# Patient Record
Sex: Female | Born: 1995 | Race: Black or African American | Hispanic: No | Marital: Single | Smoking: Former smoker
Health system: Southern US, Community
[De-identification: ages and names within clinical notes are randomized; demographics above are authoritative.]

## PROBLEM LIST (undated history)

## (undated) DIAGNOSIS — R51 Headache: Secondary | ICD-10-CM

## (undated) DIAGNOSIS — J45909 Unspecified asthma, uncomplicated: Secondary | ICD-10-CM

## (undated) DIAGNOSIS — E282 Polycystic ovarian syndrome: Secondary | ICD-10-CM

## (undated) HISTORY — DX: Headache: R51

## (undated) HISTORY — DX: Polycystic ovarian syndrome: E28.2

## (undated) HISTORY — DX: Unspecified asthma, uncomplicated: J45.909

---
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
## 2016-01-22 ENCOUNTER — Encounter (HOSPITAL_COMMUNITY): Payer: Self-pay | Admitting: Emergency Medicine

## 2016-01-22 ENCOUNTER — Emergency Department (HOSPITAL_COMMUNITY): Payer: Medicaid Other

## 2016-01-22 DIAGNOSIS — Z7722 Contact with and (suspected) exposure to environmental tobacco smoke (acute) (chronic): Secondary | ICD-10-CM | POA: Insufficient documentation

## 2016-01-22 DIAGNOSIS — J209 Acute bronchitis, unspecified: Secondary | ICD-10-CM | POA: Diagnosis not present

## 2016-01-22 DIAGNOSIS — R0789 Other chest pain: Secondary | ICD-10-CM | POA: Diagnosis present

## 2016-01-22 DIAGNOSIS — J45909 Unspecified asthma, uncomplicated: Secondary | ICD-10-CM | POA: Insufficient documentation

## 2016-01-22 LAB — URINE MICROSCOPIC-ADD ON: WBC, UA: NONE SEEN WBC/hpf (ref 0–5)

## 2016-01-22 LAB — CBC
HCT: 30.2 % — ABNORMAL LOW (ref 36.0–46.0)
Hemoglobin: 9.5 g/dL — ABNORMAL LOW (ref 12.0–15.0)
MCH: 21.7 pg — AB (ref 26.0–34.0)
MCHC: 31.5 g/dL (ref 30.0–36.0)
MCV: 69.1 fL — ABNORMAL LOW (ref 78.0–100.0)
PLATELETS: 300 10*3/uL (ref 150–400)
RBC: 4.37 MIL/uL (ref 3.87–5.11)
RDW: 16.8 % — AB (ref 11.5–15.5)
WBC: 5.6 10*3/uL (ref 4.0–10.5)

## 2016-01-22 LAB — BASIC METABOLIC PANEL
Anion gap: 7 (ref 5–15)
BUN: 12 mg/dL (ref 6–20)
CALCIUM: 9.2 mg/dL (ref 8.9–10.3)
CO2: 25 mmol/L (ref 22–32)
CREATININE: 0.62 mg/dL (ref 0.44–1.00)
Chloride: 104 mmol/L (ref 101–111)
GFR calc non Af Amer: >60 mL/min (ref >60)
Glucose, Bld: 112 mg/dL — ABNORMAL HIGH (ref 65–99)
Potassium: 3.4 mmol/L — ABNORMAL LOW (ref 3.5–5.1)
SODIUM: 136 mmol/L (ref 135–145)

## 2016-01-22 LAB — URINALYSIS, ROUTINE W REFLEX MICROSCOPIC
BILIRUBIN URINE: NEGATIVE
Glucose, UA: NEGATIVE mg/dL
KETONES UR: 15 mg/dL — AB
Leukocytes, UA: NEGATIVE
NITRITE: NEGATIVE
PH: 6 (ref 5.0–8.0)
Protein, ur: NEGATIVE mg/dL
Specific Gravity, Urine: 1.036 — ABNORMAL HIGH (ref 1.005–1.030)

## 2016-01-22 LAB — I-STAT TROPONIN, ED: TROPONIN I, POC: 0.01 ng/mL (ref 0.00–0.08)

## 2016-01-22 LAB — POC URINE PREG, ED: Preg Test, Ur: NEGATIVE

## 2016-01-22 NOTE — ED Triage Notes (Signed)
Pt. reports intermittent central chest pain with SOB , dry cough and emesis onset 2 days ago .

## 2016-01-23 ENCOUNTER — Emergency Department (HOSPITAL_COMMUNITY)
Admission: EM | Admit: 2016-01-23 | Discharge: 2016-01-23 | Disposition: A | Discharge status: Home / Self Care | Payer: Medicaid Other | Attending: Emergency Medicine | Admitting: Emergency Medicine

## 2016-01-23 DIAGNOSIS — R0789 Other chest pain: Secondary | ICD-10-CM

## 2016-01-23 DIAGNOSIS — R05 Cough: Secondary | ICD-10-CM

## 2016-01-23 DIAGNOSIS — J209 Acute bronchitis, unspecified: Secondary | ICD-10-CM

## 2016-01-23 DIAGNOSIS — R059 Cough, unspecified: Secondary | ICD-10-CM

## 2016-01-23 MED ORDER — IBUPROFEN 600 MG PO TABS: 600.0000 mg | ORAL_TABLET | Freq: Four times a day (QID) | ORAL | 0 refills | Status: DC | PRN

## 2016-01-23 NOTE — ED Provider Notes (Signed)
[facility] DEPT Provider Note   CSN: [id number] Arrival date & time: 01/22/16  2052  By signing my name below, I, Higinio Plan, attest that this documentation has been prepared under the direction and in the presence of Charlesetta Shanks, MD . Electronically Signed: Higinio Plan, Scribe. 01/23/2016. 1:34 AM.  History   Chief Complaint Chief Complaint  Patient presents with  . Chest Pain   The history is provided by the patient. No language interpreter was used.   HPI Comments: Brooke Collins is a 20 y.o. female who presents to the Emergency Department complaining of gradually worsening, central chest pain described as tightness that began 3 days ago and worsened today. Pt reports associated shortness of breath, dry cough, sinus pressure and lower abdominal pain that began 2 days ago. She states her cough is worse at night. She denies fever, sore throat, nasal drip, dysuria and recent use of antibiotics. Pt reports hx of bronchitis "a while ago" but denies hx of asthma. She notes she has an inhaler but has not used it for her current symptoms. She states she smokes marijuana 3-4 times a week and smoked once already today.   Past Medical History:  Diagnosis Date  . Asthma   . Headache(784.0)   . PCOS (polycystic ovarian syndrome)     Patient Active Problem List   Diagnosis Date Noted  . Migraine without aura, without mention of intractable migraine without mention of status migrainosus 10/09/2013  . Episodic tension type headache 10/09/2013    Past Surgical History:  Procedure Laterality Date  . ADENOIDECTOMY, TONSILLECTOMY AND MYRINGOTOMY WITH TUBE PLACEMENT Bilateral 2005    OB History    No data available     Home Medications    Prior to Admission medications   Medication Sig Start Date End Date Taking? Authorizing Provider  albuterol (PROVENTIL HFA;VENTOLIN HFA) 108 (90 BASE) MCG/ACT inhaler Inhale 2 puffs into the lungs every 6 (six) hours as needed. Shortness of breath     Historical Provider, MD  Calcium Carbonate (CALCIUM 500 PO) Take 1 tablet by mouth daily.     Historical Provider, MD  cetirizine (ZYRTEC) 10 MG tablet Take 10 mg by mouth daily as needed. For allergies    Historical Provider, MD  Cholecalciferol (VITAMIN D) 2000 UNITS CAPS Take by mouth.    Historical Provider, MD  ferrous sulfate 325 (65 FE) MG tablet Take 325 mg by mouth daily with breakfast.    Historical Provider, MD  ibuprofen (ADVIL,MOTRIN) 600 MG tablet Take 1 tablet (600 mg total) by mouth every 6 (six) hours as needed. 01/23/16   Charlesetta Shanks, MD  norethindrone-ethinyl estradiol-iron (MICROGESTIN FE,GILDESS FE,LOESTRIN FE) 1.5-30 MG-MCG tablet Take 1 tablet by mouth daily.    Historical Provider, MD  tiZANidine (ZANAFLEX) 4 MG capsule Take one capsule for persistent headache only if you are able to lay down and sleep. 11/23/13   Jodi Geralds, MD  topiramate (TOPAMAX) 25 MG tablet Take one tablet at nighttime for one week then 2 tablets at nighttime 11/23/13   Jodi Geralds, MD    Family History Family History  Problem Relation Age of Onset  . Migraines Mother     Onset at early adulthood  . Bronchitis Mother   . Hypertension Mother   . Migraines Father     Onset at early adulthood  . Bronchitis Father   . Migraines Sister   . Sickle cell trait Cousin   . Diabetes Maternal Aunt   . Diabetes Maternal  Grandmother   . Hypertension Maternal Grandmother   . Asthma Maternal Grandmother   . Asthma Other   . Hypertension Other   . Hyperlipidemia Other   . Obesity Other   . Pneumonia Paternal Grandmother   . Psoriasis Paternal Grandfather     Social History Social History  Substance Use Topics  . Smoking status: Passive Smoke Exposure - Never Smoker  . Smokeless tobacco: Never Used  . Alcohol use No     Allergies   Review of patient's allergies indicates no known allergies.   Review of Systems Review of Systems 10 systems reviewed and all are negative for  acute change except as noted in the HPI.  Physical Exam Updated Vital Signs BP 146/94 (BP Location: Left Arm)   Pulse 71   Temp 98.4 F (36.9 C) (Oral)   Resp 18   LMP 01/22/2016   SpO2 100%   Physical Exam  Constitutional: She is oriented to person, place, and time. She appears well-developed and well-nourished. No distress.  HENT:  Head: Normocephalic and atraumatic.  Slight nasal congestion, mild clear drainage. Posterior airway widely patent, no erythema or exudate on tonsils.    Eyes: EOM are normal. Pupils are equal, round, and reactive to light.  Neck: Normal range of motion.  Cardiovascular: Normal rate, regular rhythm and normal heart sounds.   Pulmonary/Chest: Effort normal and breath sounds normal.  Abdominal: Soft. She exhibits no distension. There is no tenderness.  Musculoskeletal: Normal range of motion.  BLE with no peripheral edema. Calves soft and non-tender.  Neurological: She is alert and oriented to person, place, and time.  Skin: Skin is warm and dry.  Psychiatric: She has a normal mood and affect. Judgment normal.  Nursing note and vitals reviewed.  ED Treatments / Results  Labs (all labs ordered are listed, but only abnormal results are displayed) Labs Reviewed  BASIC METABOLIC PANEL - Abnormal; Notable for the following:       Result Value   Potassium 3.4 (*)    Glucose, Bld 112 (*)    All other components within normal limits  CBC - Abnormal; Notable for the following:    Hemoglobin 9.5 (*)    HCT 30.2 (*)    MCV 69.1 (*)    MCH 21.7 (*)    RDW 16.8 (*)    All other components within normal limits  URINALYSIS, ROUTINE W REFLEX MICROSCOPIC (NOT AT [facility]) - Abnormal; Notable for the following:    Specific Gravity, Urine 1.036 (*)    Hgb urine dipstick LARGE (*)    Ketones, ur 15 (*)    All other components within normal limits  URINE MICROSCOPIC-ADD ON - Abnormal; Notable for the following:    Squamous Epithelial / LPF 0-5 (*)    Bacteria, UA  RARE (*)    Casts HYALINE CASTS (*)    All other components within normal limits  Randolm Idol, ED  POC URINE PREG, ED  I-STAT TROPOININ, ED    EKG  EKG Interpretation  Date/Time:  Thursday January 22 2016 20:56:46 EDT Ventricular Rate:  75 PR Interval:  152 QRS Duration: 92 QT Interval:  364 QTC Calculation: 406 R Axis:   84 Text Interpretation:  Normal sinus rhythm Normal ECG agree. no old comparison Confirmed by Johnney Killian, MD, Jeannie Done [phone]) on 01/23/2016 12:42:38 AM       Radiology Dg Chest 2 View  Result Date: 01/22/2016 CLINICAL DATA:  Chest tightness and dry cough for 2 days. EXAM: CHEST  2 VIEW COMPARISON:  None. FINDINGS: Cardiomediastinal silhouette is normal. No pleural effusions or focal consolidations. Trachea projects midline and there is no pneumothorax. Soft tissue planes and included osseous structures are non-suspicious. IMPRESSION: Normal chest. Electronically Signed   By: Elon Alas M.D.   On: 01/22/2016 22:58    Procedures Procedures (including critical care time)  Medications Ordered in ED Medications - No data to display  DIAGNOSTIC STUDIES:  Oxygen Saturation is 100% on RA, normal by my interpretation.    COORDINATION OF CARE:  1:31 AM Discussed treatment plan with pt at bedside and pt agreed to plan.  Initial Impression / Assessment and Plan / ED Course  I have reviewed the triage vital signs and the nursing notes.  Pertinent labs & imaging results that were available during my care of the patient were reviewed by me and considered in my medical decision making (see chart for details).  Clinical Course    I personally performed the services described in this documentation, which was scribed in my presence. The recorded information has been reviewed and is accurate.   Final Clinical Impressions(s) / ED Diagnoses   Final diagnoses:  Other chest pain  Cough  Acute bronchitis, unspecified organism   At this time, symptoms most  consistent with acute bronchitis. Patient has chest tightness with dry cough. Cardiopulmonary exam are normal. The patient does not smoke cigarettes but does smoke marijuana. Also possibly bronchial irritation secondary to smoking marijuana. Patient does not have fever or sputum. Chest x-ray and EKG are normal. Patient's cardiac enzymes are negative. She has nontender lower extremities with no calf pain or edema. Low probability for PE. Patient also describes lower abdominal cramping. Pregnancy test is negative and urinalysis is negative. Patient has no reproducible abdominal pain to palpation. At this point out any surgical etiology. Patient is counseled on signs and symptoms for which return. Symptomatic treatment of ibuprofen for chest pain. New Prescriptions New Prescriptions   IBUPROFEN (ADVIL,MOTRIN) 600 MG TABLET    Take 1 tablet (600 mg total) by mouth every 6 (six) hours as needed.     Charlesetta Shanks, MD 01/23/16 [phone]

## 2016-11-17 ENCOUNTER — Encounter (HOSPITAL_COMMUNITY): Payer: Self-pay

## 2016-11-17 DIAGNOSIS — S0101XA Laceration without foreign body of scalp, initial encounter: Secondary | ICD-10-CM | POA: Insufficient documentation

## 2016-11-17 DIAGNOSIS — Y999 Unspecified external cause status: Secondary | ICD-10-CM | POA: Diagnosis not present

## 2016-11-17 DIAGNOSIS — M791 Myalgia: Secondary | ICD-10-CM | POA: Insufficient documentation

## 2016-11-17 DIAGNOSIS — Y939 Activity, unspecified: Secondary | ICD-10-CM | POA: Insufficient documentation

## 2016-11-17 DIAGNOSIS — J45909 Unspecified asthma, uncomplicated: Secondary | ICD-10-CM | POA: Diagnosis not present

## 2016-11-17 DIAGNOSIS — Z7722 Contact with and (suspected) exposure to environmental tobacco smoke (acute) (chronic): Secondary | ICD-10-CM | POA: Insufficient documentation

## 2016-11-17 DIAGNOSIS — Y929 Unspecified place or not applicable: Secondary | ICD-10-CM | POA: Diagnosis not present

## 2016-11-17 DIAGNOSIS — Z79899 Other long term (current) drug therapy: Secondary | ICD-10-CM | POA: Diagnosis not present
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
## 2016-11-18 ENCOUNTER — Encounter (HOSPITAL_COMMUNITY): Payer: Self-pay | Admitting: *Deleted

## 2016-11-18 ENCOUNTER — Emergency Department (HOSPITAL_COMMUNITY)
Admission: EM | Admit: 2016-11-18 | Discharge: 2016-11-18 | Disposition: A | Discharge status: Home / Self Care | Payer: Medicaid Other | Attending: Emergency Medicine | Admitting: Emergency Medicine

## 2016-11-18 ENCOUNTER — Emergency Department (HOSPITAL_COMMUNITY): Payer: Medicaid Other

## 2016-11-18 DIAGNOSIS — S0101XA Laceration without foreign body of scalp, initial encounter: Secondary | ICD-10-CM

## 2016-11-18 DIAGNOSIS — M7918 Myalgia, other site: Secondary | ICD-10-CM

## 2016-11-18 LAB — POC URINE PREG, ED: PREG TEST UR: NEGATIVE

## 2016-11-18 MED ORDER — OXYCODONE-ACETAMINOPHEN 5-325 MG PO TABS
1.0000 | ORAL_TABLET | Freq: Once | ORAL | Status: AC
  Administered 2016-11-18: 1 via ORAL
  Filled 2016-11-18: qty 1

## 2016-11-18 MED ORDER — IBUPROFEN 600 MG PO TABS: 600.0000 mg | ORAL_TABLET | Freq: Four times a day (QID) | ORAL | 0 refills | Status: DC | PRN
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
## 2017-08-23 ENCOUNTER — Encounter (HOSPITAL_COMMUNITY): Payer: Self-pay

## 2017-08-23 ENCOUNTER — Emergency Department (HOSPITAL_COMMUNITY)
Admission: EM | Admit: 2017-08-23 | Discharge: 2017-08-23 | Disposition: A | Discharge status: Home / Self Care | Payer: Self-pay | Attending: Emergency Medicine | Admitting: Emergency Medicine

## 2017-08-23 DIAGNOSIS — S39011A Strain of muscle, fascia and tendon of abdomen, initial encounter: Secondary | ICD-10-CM | POA: Insufficient documentation

## 2017-08-23 DIAGNOSIS — J45909 Unspecified asthma, uncomplicated: Secondary | ICD-10-CM | POA: Insufficient documentation

## 2017-08-23 DIAGNOSIS — Y939 Activity, unspecified: Secondary | ICD-10-CM | POA: Insufficient documentation

## 2017-08-23 DIAGNOSIS — X58XXXA Exposure to other specified factors, initial encounter: Secondary | ICD-10-CM | POA: Insufficient documentation

## 2017-08-23 DIAGNOSIS — Y929 Unspecified place or not applicable: Secondary | ICD-10-CM | POA: Insufficient documentation

## 2017-08-23 DIAGNOSIS — Z7722 Contact with and (suspected) exposure to environmental tobacco smoke (acute) (chronic): Secondary | ICD-10-CM | POA: Insufficient documentation

## 2017-08-23 DIAGNOSIS — Z79899 Other long term (current) drug therapy: Secondary | ICD-10-CM | POA: Insufficient documentation

## 2017-08-23 DIAGNOSIS — Y999 Unspecified external cause status: Secondary | ICD-10-CM | POA: Insufficient documentation

## 2017-08-23 LAB — COMPREHENSIVE METABOLIC PANEL
ALT: 15 U/L (ref 14–54)
ANION GAP: 7 (ref 5–15)
AST: 15 U/L (ref 15–41)
Albumin: 3.7 g/dL (ref 3.5–5.0)
Alkaline Phosphatase: 68 U/L (ref 38–126)
BUN: 9 mg/dL (ref 6–20)
CHLORIDE: 106 mmol/L (ref 101–111)
CO2: 25 mmol/L (ref 22–32)
Calcium: 8.8 mg/dL — ABNORMAL LOW (ref 8.9–10.3)
Creatinine, Ser: 0.57 mg/dL (ref 0.44–1.00)
Glucose, Bld: 101 mg/dL — ABNORMAL HIGH (ref 65–99)
POTASSIUM: 4 mmol/L (ref 3.5–5.1)
SODIUM: 138 mmol/L (ref 135–145)
Total Bilirubin: 0.5 mg/dL (ref 0.3–1.2)
Total Protein: 7.2 g/dL (ref 6.5–8.1)

## 2017-08-23 LAB — CBC
HCT: 26.6 % — ABNORMAL LOW (ref 36.0–46.0)
HEMOGLOBIN: 8.2 g/dL — AB (ref 12.0–15.0)
MCH: 20.4 pg — ABNORMAL LOW (ref 26.0–34.0)
MCHC: 30.8 g/dL (ref 30.0–36.0)
MCV: 66.3 fL — ABNORMAL LOW (ref 78.0–100.0)
Platelets: 409 10*3/uL — ABNORMAL HIGH (ref 150–400)
RBC: 4.01 MIL/uL (ref 3.87–5.11)
RDW: 17.7 % — ABNORMAL HIGH (ref 11.5–15.5)
WBC: 6.6 10*3/uL (ref 4.0–10.5)

## 2017-08-23 LAB — URINALYSIS, ROUTINE W REFLEX MICROSCOPIC
Bilirubin Urine: NEGATIVE
GLUCOSE, UA: NEGATIVE mg/dL
Hgb urine dipstick: NEGATIVE
Ketones, ur: NEGATIVE mg/dL
LEUKOCYTES UA: NEGATIVE
Nitrite: NEGATIVE
PROTEIN: NEGATIVE mg/dL
SPECIFIC GRAVITY, URINE: 1.026 (ref 1.005–1.030)
pH: 5 (ref 5.0–8.0)

## 2017-08-23 LAB — LIPASE, BLOOD: LIPASE: 25 U/L (ref 11–51)

## 2017-08-23 LAB — I-STAT BETA HCG BLOOD, ED (MC, WL, AP ONLY)
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
## 2017-08-23 NOTE — ED Notes (Signed)
Patient given discharge instructions and verbalized understanding.  Patient stable to discharge at this time.  Patient is alert and oriented to baseline.  No distressed noted at this time.  All belongings taken with the patient at discharge.   

## 2017-08-23 NOTE — ED Triage Notes (Signed)
Pt presents for R flank and RLQ pain since Saturday. Pt reports she had one episode of emesis on Sunday evening. Reports intermittent diarrhea since Saturday. Reports pain is worse with coughing/sneezing/etc. Denies urinary symptoms.

## 2017-08-23 NOTE — Discharge Instructions (Signed)
Take 4 over the counter ibuprofen tablets 3 times a day or 2 over-the-counter naproxen tablets twice a day for pain. Also take tylenol 1000mg(2 extra strength) four times a day.    

## 2018-04-04 ENCOUNTER — Encounter (HOSPITAL_COMMUNITY): Payer: Self-pay | Admitting: *Deleted

## 2018-04-04 ENCOUNTER — Emergency Department (HOSPITAL_COMMUNITY): Payer: Managed Care, Other (non HMO)

## 2018-04-04 ENCOUNTER — Emergency Department (HOSPITAL_COMMUNITY)
Admission: EM | Admit: 2018-04-04 | Discharge: 2018-04-04 | Disposition: A | Discharge status: Home / Self Care | Payer: Managed Care, Other (non HMO) | Attending: Emergency Medicine | Admitting: Emergency Medicine

## 2018-04-04 DIAGNOSIS — J45909 Unspecified asthma, uncomplicated: Secondary | ICD-10-CM | POA: Insufficient documentation

## 2018-04-04 DIAGNOSIS — Z79899 Other long term (current) drug therapy: Secondary | ICD-10-CM | POA: Diagnosis not present

## 2018-04-04 DIAGNOSIS — Z7722 Contact with and (suspected) exposure to environmental tobacco smoke (acute) (chronic): Secondary | ICD-10-CM | POA: Insufficient documentation

## 2018-04-04 DIAGNOSIS — R102 Pelvic and perineal pain: Secondary | ICD-10-CM | POA: Insufficient documentation

## 2018-04-04 DIAGNOSIS — R1030 Lower abdominal pain, unspecified: Secondary | ICD-10-CM | POA: Diagnosis present

## 2018-04-04 DIAGNOSIS — D259 Leiomyoma of uterus, unspecified: Secondary | ICD-10-CM | POA: Insufficient documentation

## 2018-04-04 LAB — CBC
HCT: 30.1 % — ABNORMAL LOW (ref 36.0–46.0)
Hemoglobin: 8.9 g/dL — ABNORMAL LOW (ref 12.0–15.0)
MCH: 20 pg — AB (ref 26.0–34.0)
MCHC: 29.6 g/dL — AB (ref 30.0–36.0)
MCV: 67.8 fL — AB (ref 80.0–100.0)
PLATELETS: 303 10*3/uL (ref 150–400)
RBC: 4.44 MIL/uL (ref 3.87–5.11)
RDW: 19.2 % — ABNORMAL HIGH (ref 11.5–15.5)
WBC: 5.1 10*3/uL (ref 4.0–10.5)
nRBC: 0 % (ref 0.0–0.2)

## 2018-04-04 LAB — WET PREP, GENITAL
Sperm: NONE SEEN
Trich, Wet Prep: NONE SEEN

## 2018-04-04 LAB — URINALYSIS, ROUTINE W REFLEX MICROSCOPIC
Bilirubin Urine: NEGATIVE
GLUCOSE, UA: NEGATIVE mg/dL
HGB URINE DIPSTICK: NEGATIVE
Ketones, ur: NEGATIVE mg/dL
Leukocytes, UA: NEGATIVE
Nitrite: NEGATIVE
PROTEIN: NEGATIVE mg/dL
Specific Gravity, Urine: 1.018 (ref 1.005–1.030)
pH: 9 — ABNORMAL HIGH (ref 5.0–8.0)

## 2018-04-04 LAB — COMPREHENSIVE METABOLIC PANEL
ALBUMIN: 4.3 g/dL (ref 3.5–5.0)
ALK PHOS: 67 U/L (ref 38–126)
ALT: 18 U/L (ref 0–44)
ANION GAP: 9 (ref 5–15)
AST: 18 U/L (ref 15–41)
BILIRUBIN TOTAL: 0.8 mg/dL (ref 0.3–1.2)
BUN: 13 mg/dL (ref 6–20)
CALCIUM: 9.3 mg/dL (ref 8.9–10.3)
CO2: 20 mmol/L — ABNORMAL LOW (ref 22–32)
Chloride: 111 mmol/L (ref 98–111)
Creatinine, Ser: 0.59 mg/dL (ref 0.44–1.00)
GFR calc Af Amer: >60 mL/min (ref >60)
GFR calc non Af Amer: >60 mL/min (ref >60)
GLUCOSE: 83 mg/dL (ref 70–99)
Potassium: 3.9 mmol/L (ref 3.5–5.1)
Sodium: 140 mmol/L (ref 135–145)
Total Protein: 7.6 g/dL (ref 6.5–8.1)

## 2018-04-04 LAB — LIPASE, BLOOD: Lipase: 29 U/L (ref 11–51)

## 2018-04-04 LAB — I-STAT BETA HCG BLOOD, ED (MC, WL, AP ONLY): I-stat hCG, quantitative: <5 m[IU]/mL (ref <5)

## 2018-04-04 MED ORDER — MORPHINE SULFATE (PF) 4 MG/ML IV SOLN: 4.0000 mg | Freq: Once | INTRAVENOUS | Status: DC

## 2018-04-04 MED ORDER — IOHEXOL 300 MG/ML  SOLN
100.0000 mL | Freq: Once | INTRAMUSCULAR | Status: AC | PRN
  Administered 2018-04-04: 100 mL via INTRAVENOUS

## 2018-04-04 MED ORDER — ONDANSETRON HCL 4 MG/2ML IJ SOLN
4.0000 mg | Freq: Once | INTRAMUSCULAR | Status: AC
  Administered 2018-04-04: 4 mg via INTRAVENOUS
  Filled 2018-04-04: qty 2

## 2018-04-04 MED ORDER — SODIUM CHLORIDE 0.9 % IV BOLUS
1000.0000 mL | Freq: Once | INTRAVENOUS | Status: AC
Start: 2018-04-04 — End: 2018-04-04
  Administered 2018-04-04: 1000 mL via INTRAVENOUS

## 2018-04-04 MED ORDER — NAPROXEN 500 MG PO TABS: 500.0000 mg | ORAL_TABLET | Freq: Two times a day (BID) | ORAL | 0 refills | Status: DC

## 2018-04-04 NOTE — ED Notes (Signed)
Patient verbalizes understanding of discharge instructions. Opportunity for questioning and answers were provided. Armband removed by staff, pt discharged from ED ambulatory to home.  

## 2018-04-04 NOTE — Discharge Instructions (Signed)
Take Naprosyn as directed.  He can also take Tylenol and ibuprofen.  He not take it at the same time as Naprosyn.  As we discussed, follow-up with referred immediately doctor for further evaluation.  Return to the Emergency Department immediately if you experience any worsening abdominal pain, fever, persistent nausea and vomiting, inability keep any food down, pain with urination, blood in your urine or any other worsening or concerning symptoms.

## 2018-04-04 NOTE — ED Provider Notes (Addendum)
[facility] EMERGENCY DEPARTMENT Provider Note   CSN: [id number] Arrival date & time: 04/04/18  1313     History   Chief Complaint Chief Complaint  Patient presents with  . Abdominal Pain    HPI Brooke Collins is a 22 y.o. female with PMH/o Asthma, PCOS, for evaluation of lower abdominal pain that worsened last night.  She reports for about a week, she has had this intermittent bilateral lower abdominal pain.  He states that pain started having a sharp piercing pain but then started developing into an ache.  She reports that last night, the pain became acutely worse.  She states that it is worse on her right lower side.  She also reports she started having vomiting last night.  She reports several episodes of nonbloody, nonbilious vomiting that began last night.  No emesis.  She has not had any diarrhea or constipation.  Her last bowel movement was 6 and that was normal.  No presence of blood.  Patient reports that initially, she measured a fever of 101.9 a few days ago but states she has not had any fever since then.  Patient reports he took Tylenol but no improvement in her pain.  Patient states that the pain is not associated with eating or any other action and that it was occurring randomly.  She is reports that since last night, it has been constant.  Patient denies any chest pain, difficulty breathing, dysuria, hematuria, vaginal bleeding, vaginal discharge.  The history is provided by the patient.    Past Medical History:  Diagnosis Date  . Asthma   . Headache(784.0)   . PCOS (polycystic ovarian syndrome)     Patient Active Problem List   Diagnosis Date Noted  . Migraine without aura, without mention of intractable migraine without mention of status migrainosus 10/09/2013  . Episodic tension type headache 10/09/2013    Past Surgical History:  Procedure Laterality Date  . ADENOIDECTOMY, TONSILLECTOMY AND MYRINGOTOMY WITH TUBE PLACEMENT Bilateral 2005      OB History   None      Home Medications    Prior to Admission medications   Medication Sig Start Date End Date Taking? Authorizing Provider  albuterol (PROVENTIL HFA;VENTOLIN HFA) 108 (90 BASE) MCG/ACT inhaler Inhale 2 puffs into the lungs every 6 (six) hours as needed. Shortness of breath    [provider]  Calcium Carbonate (CALCIUM 500 PO) Take 1 tablet by mouth daily.     [provider]  cetirizine (ZYRTEC) 10 MG tablet Take 10 mg by mouth daily as needed. For allergies    [provider]  Cholecalciferol (VITAMIN D) 2000 UNITS CAPS Take 2,000 Units by mouth daily.     [provider]  ferrous sulfate 325 (65 FE) MG tablet Take 325 mg by mouth daily with breakfast.    [provider]  ibuprofen (ADVIL,MOTRIN) 600 MG tablet Take 1 tablet (600 mg total) by mouth every 6 (six) hours as needed. Patient not taking: Reported on 08/23/2017 11/18/16   Shary Decamp, PA-C  naproxen (NAPROSYN) 500 MG tablet Take 1 tablet (500 mg total) by mouth 2 (two) times daily. 04/04/18   Volanda Napoleon, PA-C  norethindrone-ethinyl estradiol-iron (MICROGESTIN FE,GILDESS FE,LOESTRIN FE) 1.5-30 MG-MCG tablet Take 1 tablet by mouth daily.    [provider]  tiZANidine (ZANAFLEX) 4 MG capsule Take one capsule for persistent headache only if you are able to lay down and sleep. Patient not taking: Reported on 08/23/2017 11/23/13  Jodi Geralds, MD  topiramate (TOPAMAX) 25 MG tablet Take one tablet at nighttime for one week then 2 tablets at nighttime Patient not taking: Reported on 08/23/2017 11/23/13   Jodi Geralds, MD    Family History Family History  Problem Relation Age of Onset  . Migraines Mother        Onset at early adulthood  . Bronchitis Mother   . Hypertension Mother   . Migraines Father        Onset at early adulthood  . Bronchitis Father   . Migraines Sister   . Sickle cell trait Cousin   . Diabetes Maternal Aunt   .  Diabetes Maternal Grandmother   . Hypertension Maternal Grandmother   . Asthma Maternal Grandmother   . Asthma Other   . Hypertension Other   . Hyperlipidemia Other   . Obesity Other   . Pneumonia Paternal Grandmother   . Psoriasis Paternal Grandfather     Social History Social History   Tobacco Use  . Smoking status: Passive Smoke Exposure - Never Smoker  . Smokeless tobacco: Never Used  Substance Use Topics  . Alcohol use: No  . Drug use: No     Allergies   Patient has no known allergies.   Review of Systems Review of Systems  Constitutional: Negative for fever.  Respiratory: Negative for cough and shortness of breath.   Cardiovascular: Negative for chest pain.  Gastrointestinal: Positive for abdominal pain, nausea and vomiting. Negative for constipation and diarrhea.  Genitourinary: Negative for dysuria and hematuria.  Neurological: Negative for headaches.  All other systems reviewed and are negative.    Physical Exam Updated Vital Signs BP (!) 150/103 (BP Location: Right Arm)   Pulse 83   Temp 98 F (36.7 C) (Oral)   Resp 20   SpO2 100%   Physical Exam  Constitutional: She is oriented to person, place, and time. She appears well-developed and well-nourished.  Sitting comfortably on examination table  HENT:  Head: Normocephalic and atraumatic.  Mouth/Throat: Oropharynx is clear and moist and mucous membranes are normal.  Eyes: Pupils are equal, round, and reactive to light. Conjunctivae, EOM and lids are normal.  Neck: Full passive range of motion without pain.  Cardiovascular: Normal rate, regular rhythm, normal heart sounds and normal pulses. Exam reveals no gallop and no friction rub.  No murmur heard. Pulmonary/Chest: Effort normal and breath sounds normal.  Lungs clear to auscultation bilaterally.  Symmetric chest rise.  No wheezing, rales, rhonchi.  Abdominal: Soft. Normal appearance. There is tenderness in the right lower quadrant, suprapubic area  and left lower quadrant. There is tenderness at McBurney's point. There is no rigidity, no guarding and no CVA tenderness.  Abdomen is soft, nondistended.  Tenderness noted to the lower abdomen that is worse focally in the right lower quadrant McBurney's point.  Negative Rovsing, rebounding.  No CVA tenderness bilaterally.  Genitourinary: Vagina normal and uterus normal. Cervix exhibits no motion tenderness, no discharge and no friability. Right adnexum displays tenderness. Right adnexum displays no mass. Left adnexum displays no mass and no tenderness.  Genitourinary Comments: The exam was performed with a chaperone present. Normal external female genitalia. No lesions, rash, or sores.  No CMT.  Mild right adnexal tenderness.  No right adnexal mass.  No left adnexal mass or tenderness.   Musculoskeletal: Normal range of motion.  Neurological: She is alert and oriented to person, place, and time.  Skin: Skin is warm and dry. Capillary refill takes  less than 2 seconds.  Psychiatric: She has a normal mood and affect. Her speech is normal.  Nursing note and vitals reviewed.    ED Treatments / Results  Labs (all labs ordered are listed, but only abnormal results are displayed) Labs Reviewed  WET PREP, GENITAL - Abnormal; Notable for the following components:      Result Value   Yeast Wet Prep HPF POC PRESENT (*)    Clue Cells Wet Prep HPF POC PRESENT (*)    WBC, Wet Prep HPF POC FEW (*)    All other components within normal limits  COMPREHENSIVE METABOLIC PANEL - Abnormal; Notable for the following components:   CO2 20 (*)    All other components within normal limits  CBC - Abnormal; Notable for the following components:   Hemoglobin 8.9 (*)    HCT 30.1 (*)    MCV 67.8 (*)    MCH 20.0 (*)    MCHC 29.6 (*)    RDW 19.2 (*)    All other components within normal limits  URINALYSIS, ROUTINE W REFLEX MICROSCOPIC - Abnormal; Notable for the following components:   pH 9.0 (*)    All other  components within normal limits  LIPASE, BLOOD  I-STAT BETA HCG BLOOD, ED ([facility], [facility], [facility] ONLY)  GC/CHLAMYDIA PROBE AMP ([place]) NOT AT [facility]    EKG None  Radiology US Transvaginal Non-ob  Result Date: 04/04/2018 CLINICAL DATA:  Pelvic pain. Follow-up suspected leiomyoma. History of polycystic ovarian syndrome. EXAM: TRANSABDOMINAL AND TRANSVAGINAL ULTRASOUND OF PELVIS DOPPLER ULTRASOUND OF OVARIES TECHNIQUE: Both transabdominal and transvaginal ultrasound examinations of the pelvis were performed. Transabdominal technique was performed for global imaging of the pelvis including uterus, ovaries, adnexal regions, and pelvic cul-de-sac. It was necessary to proceed with endovaginal exam following the transabdominal exam to visualize the adnexa and endometrium. Color and duplex Doppler ultrasound was utilized to evaluate blood flow to the ovaries. COMPARISON:  CT abdomen and pelvis April 04, 2018 FINDINGS: Uterus Measurements: 8 x 6 x 5.2 cm = volume: 139 mL. Homogeneously hypoechoic avascular 3.6 x 3.3 cm posterior uterine wall superficial mass. Endometrium Thickness: 12 mm.  No focal abnormality visualized. Right ovary Measurements: 3.6 x 2.8 x 2.7 cm = volume: 14.9 mL. Normal appearance/no adnexal mass. Left ovary Measurements: 4.2 x 1.6 x 1.9 cm = volume: 7.3 cc mL. Normal appearance/no adnexal mass. Pulsed Doppler evaluation of both ovaries demonstrates normal low-resistance arterial and venous waveforms. Other findings Trace free fluid. IMPRESSION: 1. No acute process in the pelvis. 2. Posterior uterine 3.6 cm subserosal leiomyoma corresponding to CT finding. Electronically Signed   By: Elon Alas M.D.   On: 04/04/2018 21:25   US Pelvis Complete  Result Date: 04/04/2018 CLINICAL DATA:  Pelvic pain. Follow-up suspected leiomyoma. History of polycystic ovarian syndrome. EXAM: TRANSABDOMINAL AND TRANSVAGINAL ULTRASOUND OF PELVIS DOPPLER ULTRASOUND OF OVARIES TECHNIQUE: Both transabdominal  and transvaginal ultrasound examinations of the pelvis were performed. Transabdominal technique was performed for global imaging of the pelvis including uterus, ovaries, adnexal regions, and pelvic cul-de-sac. It was necessary to proceed with endovaginal exam following the transabdominal exam to visualize the adnexa and endometrium. Color and duplex Doppler ultrasound was utilized to evaluate blood flow to the ovaries. COMPARISON:  CT abdomen and pelvis April 04, 2018 FINDINGS: Uterus Measurements: 8 x 6 x 5.2 cm = volume: 139 mL. Homogeneously hypoechoic avascular 3.6 x 3.3 cm posterior uterine wall superficial mass. Endometrium Thickness: 12 mm.  No focal abnormality visualized. Right ovary  Measurements: 3.6 x 2.8 x 2.7 cm = volume: 14.9 mL. Normal appearance/no adnexal mass. Left ovary Measurements: 4.2 x 1.6 x 1.9 cm = volume: 7.3 cc mL. Normal appearance/no adnexal mass. Pulsed Doppler evaluation of both ovaries demonstrates normal low-resistance arterial and venous waveforms. Other findings Trace free fluid. IMPRESSION: 1. No acute process in the pelvis. 2. Posterior uterine 3.6 cm subserosal leiomyoma corresponding to CT finding. Electronically Signed   By: Elon Alas M.D.   On: 04/04/2018 21:25   Ct Abdomen Pelvis W Contrast  Result Date: 04/04/2018 CLINICAL DATA:  Abdominal pain with vomiting EXAM: CT ABDOMEN AND PELVIS WITH CONTRAST TECHNIQUE: Multidetector CT imaging of the abdomen and pelvis was performed using the standard protocol following bolus administration of intravenous contrast. CONTRAST:  164mL OMNIPAQUE IOHEXOL 300 MG/ML  SOLN COMPARISON:  None. FINDINGS: Lower chest: There is mild atelectatic change in the left base. Lung bases otherwise are clear. Hepatobiliary: No focal liver lesions are appreciable. There is no appreciable gallbladder wall thickening. There is no evident biliary duct dilatation. Pancreas: There is no pancreatic mass or inflammatory focus. Spleen: No splenic  lesions are evident. Adrenals/Urinary Tract: Adrenals bilaterally appear normal. There is an 8 mm cyst in the upper pole of the left kidney. There is a junctional parenchymal defect in the right kidney, an anatomic variant. There is no appreciable hydronephrosis on either side. There is no appreciable renal or ureteral calculus on either side. Urinary bladder is midline. Urinary bladder wall is slightly thickened. Stomach/Bowel: There is no appreciable bowel wall or mesenteric thickening. There is no evident bowel obstruction. There is no free air or portal venous air. Vascular/Lymphatic: There is no abdominal aortic aneurysm. No evident vascular lesions. There is no adenopathy in the abdomen or pelvis. Reproductive: Uterus is anteverted. There is a mass which arises from the superior aspect of the uterine fundus measuring 3.8 x 3.4 x 3.2 cm, a presumed uterine leiomyoma. No other pelvic masses are evident. A small amount of free fluid is noted in the cul-de-sac region. Other: The appendix appears normal. There is no appreciable abscess in the abdomen or pelvis. There is no ascites beyond slight fluid in the cul-de-sac region. Musculoskeletal: There are no appreciable blastic or lytic bone lesions. There is no demonstrable abdominal wall or intramuscular lesion. IMPRESSION: 1. Urinary bladder wall appears mildly thickened. Suspect a degree of cystitis. 2.  No evident renal or ureteral calculus.  No hydronephrosis. 3. Apparent eccentric leiomyoma arising from the superior uterus measuring 3.8 x 3.4 x 3.2 cm. 4. Small amount of fluid in the cul-de-sac may be upper physiologic. 5. No evident bowel obstruction. No abscess in the abdomen or pelvis. Appendix appears normal. Electronically Signed   By: Lowella Grip III M.D.   On: 04/04/2018 19:47   [place] Art/ven Flow Abd Pelv Doppler  Result Date: 04/04/2018 CLINICAL DATA:  Pelvic pain. Follow-up suspected leiomyoma. History of polycystic ovarian syndrome. EXAM:  TRANSABDOMINAL AND TRANSVAGINAL ULTRASOUND OF PELVIS DOPPLER ULTRASOUND OF OVARIES TECHNIQUE: Both transabdominal and transvaginal ultrasound examinations of the pelvis were performed. Transabdominal technique was performed for global imaging of the pelvis including uterus, ovaries, adnexal regions, and pelvic cul-de-sac. It was necessary to proceed with endovaginal exam following the transabdominal exam to visualize the adnexa and endometrium. Color and duplex Doppler ultrasound was utilized to evaluate blood flow to the ovaries. COMPARISON:  CT abdomen and pelvis April 04, 2018 FINDINGS: Uterus Measurements: 8 x 6 x 5.2 cm = volume: 139 mL. Homogeneously hypoechoic  avascular 3.6 x 3.3 cm posterior uterine wall superficial mass. Endometrium Thickness: 12 mm.  No focal abnormality visualized. Right ovary Measurements: 3.6 x 2.8 x 2.7 cm = volume: 14.9 mL. Normal appearance/no adnexal mass. Left ovary Measurements: 4.2 x 1.6 x 1.9 cm = volume: 7.3 cc mL. Normal appearance/no adnexal mass. Pulsed Doppler evaluation of both ovaries demonstrates normal low-resistance arterial and venous waveforms. Other findings Trace free fluid. IMPRESSION: 1. No acute process in the pelvis. 2. Posterior uterine 3.6 cm subserosal leiomyoma corresponding to CT finding. Electronically Signed   By: Elon Alas M.D.   On: 04/04/2018 21:25    Procedures Procedures (including critical care time)  Medications Ordered in ED Medications  morphine 4 MG/ML injection 4 mg (4 mg Intravenous Not Given 04/04/18 1943)  sodium chloride 0.9 % bolus 1,000 mL (0 mLs Intravenous Stopped 04/04/18 2148)  ondansetron (ZOFRAN) injection 4 mg (4 mg Intravenous Given 04/04/18 1539)  iohexol (OMNIPAQUE) 300 MG/ML solution 100 mL (100 mLs Intravenous Contrast Given 04/04/18 1920)     Initial Impression / Assessment and Plan / ED Course  I have reviewed the triage vital signs and the nursing notes.  Pertinent labs & imaging results that  were available during my care of the patient were reviewed by me and considered in my medical decision making (see chart for details).     22 year old female who presents for evaluation of lower abdominal pain that worsened last night.  Associated with nausea and vomiting.  Fever initially few days ago but states that has resolved.  No diarrhea, constipation.  No vaginal complaints, urinary complaints. Patient is afebrile, non-toxic appearing, sitting comfortably on examination table. Vital signs reviewed and stable.  On exam, patient has tenderness to the lower abdomen, particularly the right lower quadrant McBurney's point.  No CVA tenderness noted.  Consider infectious GI process versus appendicitis versus backslash [place].  History/physical exam not concerning for ovarian torsion.  Initial labs ordered at triage.  Given tenderness, will plan for CT and pelvis rule out appendicitis.  I-STAT beta negative.  Lipase unremarkable.  CBC shows hemoglobin of 8.9, hematocrit of 30.1.  Review of records shows consistent with previous.  Patient does report she has a history of iron deficiency anemia and does report she has not been taking her iron pills. CMP shows bicarb of 20.  No other abnormalities.  UA negative for any infectious etiology.  CT scan shows apparent eccentrically myoma arising from the superior uterus measuring 3 x 3 cm.  Normal appendix.  No evidence of any other abnormalities.  Discussed results with patient.  Informed patient of renal cyst and uterine fibroids.   Pelvic exam as documented above.  No CMT that would be concerning for PID.  Patient did have some right adnexal tenderness.  No discharge noted.  Given tenderness, will plan for ultrasound to evaluate for any ovarian etiology.  Ultrasound reviewed.  No evidence ovarian torsion.  Additionally, confirms posterior uterine 3.6 cm sub-serosal leiomyoma.  No other acute abnormalities.  Wet prep shows positive clue cells.  Patient not  symptomatic.  Will not treat.  Discussed results with patient.  Patient reports her pain is improved slightly.  Repeat abdominal exam shows improved tenderness.  Vitals are stable.  She has been tolerating p.o. in the department any difficulty. At this time, patient exhibits no emergent life-threatening condition that require further evaluation in ED. Will give her outpatient OB/GYN for patient to follow-up with regarding uterine fibroids. Patient had ample opportunity for  questions and discussion. All patient's questions were answered with full understanding. Strict return precautions discussed. Patient expresses understanding and agreement to plan.   Final Clinical Impressions(s) / ED Diagnoses   Final diagnoses:  Uterine leiomyoma, unspecified location    ED Discharge Orders         Ordered    naproxen (NAPROSYN) 500 MG tablet  2 times daily     04/04/18 2140           Volanda Napoleon, PA-C 04/04/18 2300    Volanda Napoleon, PA-C 04/04/18 2310    Daleen Bo, MD 04/05/18 2114

## 2018-04-04 NOTE — ED Triage Notes (Signed)
Pt in c/o abd pain for the last few days, vomiting started last night, pain is suprapubic, also mild lower back pain, denies vaginal discharge, denies dysuria, no distress noted

## 2018-04-05 LAB — GC/CHLAMYDIA PROBE AMP (~~LOC~~) NOT AT ARMC
Chlamydia: POSITIVE — AB
Neisseria Gonorrhea: NEGATIVE

## 2020-01-01 ENCOUNTER — Other Ambulatory Visit: Payer: Self-pay | Admitting: Obstetrics and Gynecology

## 2020-01-01 DIAGNOSIS — Z3A18 18 weeks gestation of pregnancy: Secondary | ICD-10-CM

## 2020-01-01 DIAGNOSIS — Z363 Encounter for antenatal screening for malformations: Secondary | ICD-10-CM

## 2020-01-01 DIAGNOSIS — O30002 Twin pregnancy, unspecified number of placenta and unspecified number of amniotic sacs, second trimester: Secondary | ICD-10-CM

## 2020-01-08 ENCOUNTER — Encounter: Payer: Self-pay | Admitting: *Deleted

## 2020-01-10 ENCOUNTER — Other Ambulatory Visit: Payer: Self-pay

## 2020-01-10 ENCOUNTER — Ambulatory Visit: Payer: Medicaid Other | Attending: Obstetrics and Gynecology

## 2020-01-10 ENCOUNTER — Other Ambulatory Visit: Payer: Self-pay | Admitting: Obstetrics and Gynecology

## 2020-01-10 ENCOUNTER — Ambulatory Visit: Payer: Medicaid Other | Admitting: *Deleted

## 2020-01-10 ENCOUNTER — Other Ambulatory Visit: Payer: Self-pay | Admitting: *Deleted

## 2020-01-10 VITALS — BP 125/80 | HR 87 | Ht 60.0 in

## 2020-01-10 DIAGNOSIS — O30049 Twin pregnancy, dichorionic/diamniotic, unspecified trimester: Secondary | ICD-10-CM

## 2020-01-10 DIAGNOSIS — E669 Obesity, unspecified: Secondary | ICD-10-CM

## 2020-01-10 DIAGNOSIS — O30002 Twin pregnancy, unspecified number of placenta and unspecified number of amniotic sacs, second trimester: Secondary | ICD-10-CM

## 2020-01-10 DIAGNOSIS — O3412 Maternal care for benign tumor of corpus uteri, second trimester: Secondary | ICD-10-CM

## 2020-01-10 DIAGNOSIS — D259 Leiomyoma of uterus, unspecified: Secondary | ICD-10-CM | POA: Diagnosis not present

## 2020-01-10 DIAGNOSIS — O99212 Obesity complicating pregnancy, second trimester: Secondary | ICD-10-CM | POA: Diagnosis not present

## 2020-01-10 DIAGNOSIS — Z363 Encounter for antenatal screening for malformations: Secondary | ICD-10-CM

## 2020-01-10 DIAGNOSIS — Z3A18 18 weeks gestation of pregnancy: Secondary | ICD-10-CM

## 2020-01-10 DIAGNOSIS — O30042 Twin pregnancy, dichorionic/diamniotic, second trimester: Secondary | ICD-10-CM | POA: Diagnosis not present

## 2020-01-10 DIAGNOSIS — O321XX2 Maternal care for breech presentation, fetus 2: Secondary | ICD-10-CM | POA: Diagnosis not present

## 2020-01-15 ENCOUNTER — Ambulatory Visit (HOSPITAL_COMMUNITY)
Admission: EM | Admit: 2020-01-15 | Discharge: 2020-01-15 | Disposition: A | Discharge status: Home / Self Care | Payer: Medicaid Other

## 2020-01-24 ENCOUNTER — Ambulatory Visit: Payer: Medicaid Other | Admitting: *Deleted

## 2020-01-24 ENCOUNTER — Other Ambulatory Visit: Payer: Self-pay

## 2020-01-24 ENCOUNTER — Ambulatory Visit: Payer: Medicaid Other | Attending: Obstetrics and Gynecology

## 2020-01-24 ENCOUNTER — Other Ambulatory Visit: Payer: Self-pay | Admitting: *Deleted

## 2020-01-24 VITALS — BP 119/66 | HR 90

## 2020-01-24 DIAGNOSIS — D259 Leiomyoma of uterus, unspecified: Secondary | ICD-10-CM

## 2020-01-24 DIAGNOSIS — O30049 Twin pregnancy, dichorionic/diamniotic, unspecified trimester: Secondary | ICD-10-CM | POA: Diagnosis not present

## 2020-01-24 DIAGNOSIS — O30042 Twin pregnancy, dichorionic/diamniotic, second trimester: Secondary | ICD-10-CM

## 2020-01-24 DIAGNOSIS — O358XX0 Maternal care for other (suspected) fetal abnormality and damage, not applicable or unspecified: Secondary | ICD-10-CM

## 2020-01-24 DIAGNOSIS — Z363 Encounter for antenatal screening for malformations: Secondary | ICD-10-CM

## 2020-01-24 DIAGNOSIS — O3412 Maternal care for benign tumor of corpus uteri, second trimester: Secondary | ICD-10-CM

## 2020-01-24 DIAGNOSIS — O99212 Obesity complicating pregnancy, second trimester: Secondary | ICD-10-CM

## 2020-01-24 DIAGNOSIS — Z3A20 20 weeks gestation of pregnancy: Secondary | ICD-10-CM

## 2020-02-05 ENCOUNTER — Inpatient Hospital Stay (HOSPITAL_BASED_OUTPATIENT_CLINIC_OR_DEPARTMENT_OTHER): Payer: Medicaid Other

## 2020-02-05 ENCOUNTER — Encounter (HOSPITAL_COMMUNITY): Payer: Self-pay | Admitting: Obstetrics & Gynecology

## 2020-02-05 ENCOUNTER — Other Ambulatory Visit: Payer: Self-pay

## 2020-02-05 ENCOUNTER — Observation Stay (HOSPITAL_COMMUNITY)
Admission: EM | Admit: 2020-02-05 | Discharge: 2020-02-05 | Disposition: A | Discharge status: Home / Self Care | Payer: Medicaid Other | Attending: Obstetrics and Gynecology | Admitting: Obstetrics and Gynecology

## 2020-02-05 DIAGNOSIS — O4692 Antepartum hemorrhage, unspecified, second trimester: Secondary | ICD-10-CM | POA: Insufficient documentation

## 2020-02-05 DIAGNOSIS — O42912 Preterm premature rupture of membranes, unspecified as to length of time between rupture and onset of labor, second trimester: Secondary | ICD-10-CM | POA: Diagnosis not present

## 2020-02-05 DIAGNOSIS — O30049 Twin pregnancy, dichorionic/diamniotic, unspecified trimester: Secondary | ICD-10-CM | POA: Diagnosis not present

## 2020-02-05 DIAGNOSIS — Z87891 Personal history of nicotine dependence: Secondary | ICD-10-CM | POA: Insufficient documentation

## 2020-02-05 DIAGNOSIS — Z20822 Contact with and (suspected) exposure to covid-19: Secondary | ICD-10-CM | POA: Diagnosis not present

## 2020-02-05 DIAGNOSIS — O30002 Twin pregnancy, unspecified number of placenta and unspecified number of amniotic sacs, second trimester: Secondary | ICD-10-CM

## 2020-02-05 DIAGNOSIS — O30042 Twin pregnancy, dichorionic/diamniotic, second trimester: Secondary | ICD-10-CM | POA: Diagnosis present

## 2020-02-05 DIAGNOSIS — O4292 Full-term premature rupture of membranes, unspecified as to length of time between rupture and onset of labor: Secondary | ICD-10-CM

## 2020-02-05 DIAGNOSIS — Z3A22 22 weeks gestation of pregnancy: Secondary | ICD-10-CM

## 2020-02-05 DIAGNOSIS — O42919 Preterm premature rupture of membranes, unspecified as to length of time between rupture and onset of labor, unspecified trimester: Secondary | ICD-10-CM | POA: Diagnosis present

## 2020-02-05 DIAGNOSIS — O321XX2 Maternal care for breech presentation, fetus 2: Secondary | ICD-10-CM | POA: Diagnosis not present

## 2020-02-05 LAB — CBC
HCT: 27.3 % — ABNORMAL LOW (ref 36.0–46.0)
Hemoglobin: 8.3 g/dL — ABNORMAL LOW (ref 12.0–15.0)
MCH: 22.1 pg — ABNORMAL LOW (ref 26.0–34.0)
MCHC: 30.4 g/dL (ref 30.0–36.0)
MCV: 72.8 fL — ABNORMAL LOW (ref 80.0–100.0)
Platelets: 214 10*3/uL (ref 150–400)
RBC: 3.75 MIL/uL — ABNORMAL LOW (ref 3.87–5.11)
RDW: 15.7 % — ABNORMAL HIGH (ref 11.5–15.5)
WBC: 6.1 10*3/uL (ref 4.0–10.5)
nRBC: 0 % (ref 0.0–0.2)

## 2020-02-05 LAB — TYPE AND SCREEN
ABO/RH(D): A POS
Antibody Screen: NEGATIVE

## 2020-02-05 LAB — URINALYSIS, ROUTINE W REFLEX MICROSCOPIC
Bilirubin Urine: NEGATIVE
Glucose, UA: NEGATIVE mg/dL
Ketones, ur: NEGATIVE mg/dL
Nitrite: NEGATIVE
Protein, ur: NEGATIVE mg/dL
Specific Gravity, Urine: 1.003 — ABNORMAL LOW (ref 1.005–1.030)
pH: 8 (ref 5.0–8.0)

## 2020-02-05 LAB — RESPIRATORY PANEL BY RT PCR (FLU A&B, COVID)
Influenza A by PCR: NEGATIVE
Influenza B by PCR: NEGATIVE
SARS Coronavirus 2 by RT PCR: NEGATIVE

## 2020-02-05 LAB — POCT FERN TEST: POCT Fern Test: POSITIVE

## 2020-02-05 MED ORDER — DOCUSATE SODIUM 100 MG PO CAPS: 100.0000 mg | ORAL_CAPSULE | Freq: Every day | ORAL | Status: DC

## 2020-02-05 MED ORDER — SODIUM CHLORIDE 0.9 % IV SOLN
2.0000 g | Freq: Four times a day (QID) | INTRAVENOUS | Status: DC
  Administered 2020-02-05: 2 g via INTRAVENOUS
  Filled 2020-02-05: qty 2000

## 2020-02-05 MED ORDER — BETAMETHASONE SOD PHOS & ACET 6 (3-3) MG/ML IJ SUSP
12.0000 mg | INTRAMUSCULAR | Status: DC
  Administered 2020-02-05: 12 mg via INTRAMUSCULAR
  Filled 2020-02-05: qty 5

## 2020-02-05 MED ORDER — CALCIUM CARBONATE ANTACID 500 MG PO CHEW: 2.0000 | CHEWABLE_TABLET | ORAL | Status: DC | PRN

## 2020-02-05 MED ORDER — SODIUM CHLORIDE 0.9 % IV SOLN
250.0000 mg | Freq: Four times a day (QID) | INTRAVENOUS | Status: DC
  Administered 2020-02-05: 250 mg via INTRAVENOUS
  Filled 2020-02-05 (×5): qty 5

## 2020-02-05 MED ORDER — ZOLPIDEM TARTRATE 5 MG PO TABS: 5.0000 mg | ORAL_TABLET | Freq: Every evening | ORAL | Status: DC | PRN

## 2020-02-05 MED ORDER — PRENATAL MULTIVITAMIN CH: 1.0000 | ORAL_TABLET | Freq: Every day | ORAL | Status: DC

## 2020-02-05 MED ORDER — ACETAMINOPHEN 325 MG PO TABS: 650.0000 mg | ORAL_TABLET | ORAL | Status: DC | PRN

## 2020-02-05 NOTE — MAU Provider Note (Signed)
History     CSN: [id number]  Arrival date and time: 02/05/20 0841   First Provider Initiated Contact with Patient 02/05/20 [phone]     Chief Complaint  Patient presents with  . Vaginal Bleeding    with lower abdominal cramping   HPI Brooke Collins is a 24 y.o. G1P0 at 11w4dwho presents to MAU with chief complaint of vaginal bleeding and lower abdominal cramping. These are new problems, onset this morning around 0645. She has visualized spotting when she wipes after voiding but is unsure about additional spotting.  Patient states her abdominal pain stops and starts and she rates her pain as 7/10. She denies aggravating or alleviating factors. She has not taken medication or tried other treatments for this complaint.  Patient also endorses leaking of fluid since 02/02/2020 with continuous leaking since that time.   Patient's pregnancy is c/b di/di twin gestation, preterm cervical dilation, on vaginal progesterone. She receives care with [facility].   OB History    Gravida  1   Para      Term      Preterm      AB      Living  0     SAB      TAB      Ectopic      Multiple      Live Births              Past Medical History:  Diagnosis Date  . Asthma   . Headache(784.0)   . PCOS (polycystic ovarian syndrome)     Past Surgical History:  Procedure Laterality Date  . ADENOIDECTOMY, TONSILLECTOMY AND MYRINGOTOMY WITH TUBE PLACEMENT Bilateral 2005    Family History  Problem Relation Age of Onset  . Migraines Mother        Onset at early adulthood  . Bronchitis Mother   . Hypertension Mother   . Migraines Father        Onset at early adulthood  . Bronchitis Father   . Migraines Sister   . Sickle cell trait Cousin   . Diabetes Maternal Aunt   . Diabetes Maternal Grandmother   . Hypertension Maternal Grandmother   . Asthma Maternal Grandmother   . Asthma Other   . Hypertension Other   . Hyperlipidemia Other   . Obesity Other   . Pneumonia Paternal  Grandmother   . Psoriasis Paternal Grandfather     Social History   Tobacco Use  . Smoking status: Former Smoker    Types: Cigars    Quit date: 06/12/2019    Years since quitting: 0.6  . Smokeless tobacco: Never Used  Substance Use Topics  . Alcohol use: No  . Drug use: No    Allergies: No Known Allergies  Medications Prior to Admission  Medication Sig Dispense Refill Last Dose  . albuterol (PROVENTIL HFA;VENTOLIN HFA) 108 (90 BASE) MCG/ACT inhaler Inhale 2 puffs into the lungs every 6 (six) hours as needed. Shortness of breath    Past Month at Unknown time  . ferrous sulfate 325 (65 FE) MG tablet Take 325 mg by mouth daily with breakfast.   02/04/2020 at Unknown time  . metroNIDAZOLE (FLAGYL PO) Take by mouth.   Past Week at Unknown time  . Prenatal Vit-Fe Fumarate-FA (PRENATAL VITAMINS PO) Take by mouth.   02/04/2020 at Unknown time  . Calcium Carbonate (CALCIUM 500 PO) Take 1 tablet by mouth daily.  (Patient not taking: Reported on 01/10/2020)   More than a month  at Unknown time  . cetirizine (ZYRTEC) 10 MG tablet Take 10 mg by mouth daily as needed. For allergies (Patient not taking: Reported on 01/10/2020)   More than a month at Unknown time  . Cholecalciferol (VITAMIN D) 2000 UNITS CAPS Take 2,000 Units by mouth daily.  (Patient not taking: Reported on 01/10/2020)       Review of Systems  Gastrointestinal: Positive for abdominal pain.  Genitourinary: Positive for vaginal bleeding and vaginal discharge.  Musculoskeletal: Negative for back pain.  All other systems reviewed and are negative.  Physical Exam   Blood pressure (!) 153/70, pulse 88, temperature 98.3 F (36.8 C), temperature source Oral, resp. rate 18, height 5' (1.524 m), last menstrual period 08/31/2019, SpO2 100 %.  Physical Exam Vitals and nursing note reviewed. Exam conducted with a chaperone present.  Cardiovascular:     Rate and Rhythm: Normal rate.     Pulses: Normal pulses.     Heart sounds: Normal  heart sounds.  Pulmonary:     Effort: Pulmonary effort is normal.     Breath sounds: Normal breath sounds.  Abdominal:     Comments: Gravid  Genitourinary:    Comments: Patient's thighs and perineum noted to be wet when positioned for SSE. Active leaking of fluid with insertion of speculum. Cervix visually 3-4 cm dilated, membranes visualized protruding through cervical os. Skin:    Capillary Refill: Capillary refill takes less than 2 seconds.  Neurological:     General: No focal deficit present.     Mental Status: She is alert.  Psychiatric:        Mood and Affect: Mood normal.        Behavior: Behavior normal.        Thought Content: Thought content normal.        Judgment: Judgment normal.     MAU Course  Procedures  --ED phone call to [place] indicating patient transfer "6 months, bleeding and cramping". Patient met by CNM in lobby prior to registration due to report. Patient visually uncomfortable, rates her pain 7/10 to CNM. CNM returned to MAU RN station, gave report to MAU bedside RN. --Report received from [facility] Provider shortly after patient's registration in MAU --CNM at bedside for pelvic exam. Patient's thighs and perineum visibly wet. Patient reports gush of fluid on Saturday, with continuous leaking since that time   Patient Vitals for the past 24 hrs:  BP Temp Temp src Pulse Resp SpO2 Height  02/05/20 0930 125/71 -- -- 86 -- -- --  02/05/20 0923 126/76 -- -- 86 -- -- --  02/05/20 0917 (!) 153/70 98.3 F (36.8 C) Oral 88 18 -- 5' (1.524 m)  02/05/20 0916 -- -- -- -- -- 100 % --  02/05/20 0845 125/78 98.4 F (36.9 C) Oral 81 16 98 % --     Assessment and Plan  --24 y.o. G1P0 at 45w4dwith di/di twin gestation --FHT 135 and 112by Doppler --Grossly ruptured 02/02/2020 (positive pooling, positive fern) --Visually 3-4cm on speculum exam --Report called to Dr. PAlwyn Peaat [phone]--[facility], 12:58 PM

## 2020-02-05 NOTE — ED Notes (Signed)
Report given to MAU charge. Pt evaluated by Dr. Langston Masker.

## 2020-02-05 NOTE — ED Triage Notes (Signed)
Emergency Medicine Provider OB Triage Evaluation Note  Brooke Collins is a 24 y.o. female, G1P0, at [redacted]w[redacted]d gestation who presents to the emergency department with complaints of vaginal bleeding and abdominal cramping, onset this morning.  She reports a high-risk pregnancy with 1 cm cervical dilation on prior exams, follows with [facility].  She noted blood after urination this morning, and is having "cramps" in her lower abdomen that come and go.  Review of  Systems  Positive: Abdominal cramping, vaginal bleeding Negative: Lightheadedness, SOB, CP, dizziness.  Physical Exam  BP 125/78    Pulse 81    Temp 98.4 F (36.9 C) (Oral)    Resp 16    LMP 08/31/2019    SpO2 98%  General: Awake, no distress  HEENT: Atraumatic  Resp: Normal effort  Cardiac: Normal rate Abd: Nondistended, nontender  MSK: Moves all extremities without difficulty Neuro: Speech clear  Medical Decision Making  Pt evaluated for pregnancy concern and is stable for transfer to MAU. Pt is in agreement with plan for transfer.    9:04 AM Discussed with MAU APP, Sam, who accepts patient in transfer.  Clinical Impression  CC:  Abdominal cramping, vaginal bleeding, 2nd trimester pregnancy    Adryanna Friedt, Carola Rhine, MD 02/05/20 [id number]

## 2020-02-05 NOTE — Progress Notes (Signed)
MD Note:  Patient seen and evaluated at bedside.  Patient is well known to me as she was seen by me in the office for her prenatal care.  She was diagnosed with vaginal trichomoniasis and chlamydia and treated appropriately.  Patient presented to MAU today with prolonged premature rupture of membranes and vaginal spotting.  She is known di / di twin pregnancy.  Fern and examination done by provider in MAU confirmed rupture of membranes.   Patient admitted to antepartum service. IV hydration administered, latency antibiotics and Maternal  Fetal Medicine and the Neonatology team consulted.   After discussion with mother, she desires full intervention and resuscitation if the babies are born prior to 61 weeks.  The Neonatology team here at [facility] are unable to resuscitate the neonates younger than 23 weeks.   [place] [facility] team was contacted and if there is an accepting MFM team they would accept the neonates if delivered and attempt resuscitation.   Discussed the above with the mother. Awaiting [facility] to return call.   Rogelio Seen Malavika Lira

## 2020-02-05 NOTE — Consult Note (Signed)
Neonatology Consult to Antenatal Patient:  I was asked by Dr. Alwyn Pea to see this patient in order to provide antenatal counseling due to extreme prematurity.  Ms. Oboyle was admitted to MAU this morning at 22 4/[redacted] weeks GA with di/di twins. She is currently having active labor, is partially dilated, and ruptured. She is getting BMZ and latency IV Ampicillin.  Anticipates a boy and a girl.  Desires for intervention of the babies if possible if they deliver at this time. MFM and [place] pending.  I spoke with the patient and her mother. We discussed worst case delivery today and that survivability at this GA is virtually nil with very high co morbidities.  In light of this info, they expressed understanding of our NICU policy not to resuscitate those fetuses <23 weeks unless inaccurate dating is suspected.  If intervention desired in light of this info, they may elect to transfer to another facility that would consider doing so.  They were very appreciative of the info and time spent with them   Thank you for asking me to see this patient.  Monia Sabal Katherina Mires, MD Neonatologist  The total length of face-to-face or floor/unit time for this encounter was 25 minutes. Counseling and/or coordination of care was 40 minutes of the above.

## 2020-02-05 NOTE — Progress Notes (Signed)
Spoke w/ Dr. Annamaria Boots for MFM consult. Recommend U/S for EFW and AFI. MD will see pt and discuss further management recommendations.    Burman Foster, MSN, CNM 02/05/2020 10:02 AM

## 2020-02-05 NOTE — Progress Notes (Signed)
Notified Burman Foster, CNM regarding preliminary ultrasound results and the recommendations from the Neonatologist. CNM stated that she would notify Dr. Alwyn Pea. Mo new orders received at this time.

## 2020-02-05 NOTE — H&P (Signed)
OB ADMISSION/ HISTORY & PHYSICAL:  Admission Date: 02/05/2020  8:41 AM  Admit Diagnosis: PPROM Spont Di/Di Twins  Brooke Collins is a 25 y.o. female G1P0 [redacted]w[redacted]d presenting for LOF. Reports abd cramping and vaginal bleeding since this AM. Recent dx of trich and chlamydia and treatment started.    History of current pregnancy: G1P0   Patient entered care with [facility] at 12 wks.   EDC 06/06/20  by [place] Significant prenatal events:  Patient Active Problem List   Diagnosis Date Noted  . 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 Preterm premature rupture of membranes (PPROM) with unknown onset of labor 02/05/2020  . Migraine without aura, without mention of intractable migraine without mention of status migrainosus 10/09/2013  . Episodic tension type headache 10/09/2013    Prenatal Labs: ABO, Rh: --/--/A POS (10/12 1118) Antibody: NEG (10/12 1118) Rubella:   immune RPR:   NR HBsAg:   NR HIV:   NR GTT: not done, A1C 5.3 in 1st trimester GBS:   not done GC/CHL: neg/ pos chlamydia in 2nd trimester Genetics: low-risk female and female, [place] nomral    OB History  Gravida Para Term Preterm AB Living  1         0  SAB TAB Ectopic Multiple Live Births               # Outcome Date GA Lbr Len/2nd Weight Sex Delivery Anes PTL Lv  1 Current             Medical / Surgical History: Past medical history:  Past Medical History:  Diagnosis Date  . Asthma   . Headache(784.0)   . PCOS (polycystic ovarian syndrome)     Past surgical history:  Past Surgical History:  Procedure Laterality Date  . ADENOIDECTOMY, TONSILLECTOMY AND MYRINGOTOMY WITH TUBE PLACEMENT Bilateral 2005   Family History:  Family History  Problem Relation Age of Onset  . Migraines Mother        Onset at early adulthood  . Bronchitis Mother   . Hypertension Mother   . Migraines Father        Onset at early adulthood  . Bronchitis Father   . Migraines Sister   . Sickle cell trait Cousin   . Diabetes  Maternal Aunt   . Diabetes Maternal Grandmother   . Hypertension Maternal Grandmother   . Asthma Maternal Grandmother   . Asthma Other   . Hypertension Other   . Hyperlipidemia Other   . Obesity Other   . Pneumonia Paternal Grandmother   . Psoriasis Paternal Grandfather     Social History:  reports that she quit smoking about 7 months ago. Her smoking use included cigars. She has never used smokeless tobacco. She reports that she does not drink alcohol and does not use drugs.  Allergies: Patient has no known allergies.   Current Medications at time of admission:  Prior to Admission medications   Medication Sig Start Date End Date Taking? Authorizing Provider  albuterol (PROVENTIL HFA;VENTOLIN HFA) 108 (90 BASE) MCG/ACT inhaler Inhale 2 puffs into the lungs every 6 (six) hours as needed. Shortness of breath    Yes [provider]  ferrous sulfate 325 (65 FE) MG tablet Take 325 mg by mouth daily with breakfast.   Yes [provider]  metroNIDAZOLE (FLAGYL PO) Take by mouth.   Yes [provider]  Prenatal Vit-Fe Fumarate-FA (PRENATAL VITAMINS PO) Take by mouth.   Yes [provider]  Calcium Carbonate (CALCIUM 500  PO) Take 1 tablet by mouth daily.  Patient not taking: Reported on 01/10/2020    [provider]  cetirizine (ZYRTEC) 10 MG tablet Take 10 mg by mouth daily as needed. For allergies Patient not taking: Reported on 01/10/2020    [provider]  Cholecalciferol (VITAMIN D) 2000 UNITS CAPS Take 2,000 Units by mouth daily.  Patient not taking: Reported on 01/10/2020    [provider]    Review of Systems: Constitutional: Negative   HENT: Negative   Eyes: Negative   Respiratory: Negative   Cardiovascular: Negative   Gastrointestinal: Negative  Genitourinary: neg for bloody show, pos for LOF   Musculoskeletal: Negative   Skin: Negative   Neurological: Negative   Endo/Heme/Allergies: Negative    Psychiatric/Behavioral: Negative    Physical Exam: VS: Blood pressure 125/71, pulse 86, temperature 98.3 F (36.8 C), temperature source Oral, resp. rate 18, height 5' (1.524 m), last menstrual period 08/31/2019, SpO2 100 %. AAO x3, no signs of distress Cardiovascular: RRR Respiratory: unlabored GU/GI: Abdomen gravid, non-tender, non-distended Extremities: no edema, negative for pain, tenderness, and cords  Cervical exam: 3 cm FHR: baseline rate   Twin A 140's/ Twin B 150's TOCO: no ctx   Prenatal Transfer Tool  Maternal Diabetes: No Genetic Screening: Normal Maternal Ultrasounds/Referrals: Normal Fetal Ultrasounds or other Referrals:  None Maternal Substance Abuse:  No Significant Maternal Medications:  Meds include: Other:  Betamethasone, Ampicillin, Erythromycin Significant Maternal Lab Results: None    Assessment: 24 y.o. G1P0 [redacted]w[redacted]d Spont Twin IUP Di/Di PPROM of twin A    -reactive x2    -EFW Twin A 1# 5 oz and vertex, Twin B 1# 4 oz and breech  GBS unknown    Plan:   MFM and NICU consults completed Ultrasound completed Transfer to [facility] Antenatal steroids Latency antibiotics    -Ampicillin 2G Q 6 hrs IVPB    -Erythromycin 250 mg IV Q 6hrs  Trendelenburg position Dr Alwyn Pea coordinated plan of care and transfer  Arrie Eastern MSN, CNM 02/05/2020 1:18 PM

## 2020-02-05 NOTE — MAU Note (Signed)
Pt states that she noticed bright red blood in the toilet and when wiping this morning with abdominal cramping.

## 2020-02-07 ENCOUNTER — Ambulatory Visit: Payer: Medicaid Other

## 2020-02-07 NOTE — Discharge Summary (Signed)
Physician Discharge Summary  Patient ID: Brooke Collins MRN: [id number] DOB/AGE: 09/10/1995 24 y.o.  Admit date: 02/05/2020 Transfer date: 02/05/2020  Admission Diagnoses: Spontaneous Di/Di twin IUP  With premature preterm rupture of membranes  Discharge Diagnoses:  Active Problems:   Twin gestation, dichorionic diamniotic   Preterm premature rupture of membranes (PPROM) with unknown onset of labor   Transfer Condition: stable  Hospital Course: IV hydration, antenatal steroids  Consults: Maternal Fetal Medicine and Neonatology  Significant Diagnostic Studies: radiology: Ultrasound: EFW A-1# 5 oz B-1# 4 oz  Treatments: IV hydration, antibiotics: Ampicillin, Erythromycin and steroids: betamethasone  Discharge Exam: Blood pressure 132/76, pulse 90, temperature 98.2 F (36.8 C), temperature source Oral, resp. rate 17, height 5' (1.524 m), last menstrual period 08/31/2019, SpO2 100 %. General appearance: alert, cooperative and no distress GI: soft, non-tender; bowel sounds normal; no masses,  no organomegaly Pelvic: deferred Extremities: negative for edema, pain, tenderness, or cords  Disposition: Discharge disposition: [place] Not Defined       Discharge Instructions    Discharge activity: Bedrest   Complete by: As directed      Allergies as of 02/05/2020   No Known Allergies     Medication List    ASK your doctor about these medications   albuterol 108 (90 Base) MCG/ACT inhaler Commonly known as: VENTOLIN HFA Inhale 2 puffs into the lungs every 6 (six) hours as needed. Shortness of breath   CALCIUM 500 PO Take 1 tablet by mouth daily.   cetirizine 10 MG tablet Commonly known as: ZYRTEC Take 10 mg by mouth daily as needed. For allergies   ferrous sulfate 325 (65 FE) MG tablet Take 325 mg by mouth daily with breakfast.   FLAGYL PO Take by mouth.   PRENATAL VITAMINS PO Take by mouth.   Vitamin D 50 MCG (2000 UT) Caps Take  2,000 Units by mouth daily.        Signed: Arrie Eastern 02/07/2020, 2:42 PM

## 2020-09-10 ENCOUNTER — Other Ambulatory Visit: Payer: Self-pay

## 2020-09-10 ENCOUNTER — Encounter (HOSPITAL_COMMUNITY): Payer: Self-pay | Admitting: Emergency Medicine

## 2020-09-10 ENCOUNTER — Emergency Department (HOSPITAL_COMMUNITY)
Admission: EM | Admit: 2020-09-10 | Discharge: 2020-09-10 | Disposition: A | Discharge status: Home / Self Care | Payer: Medicaid Other | Attending: Emergency Medicine | Admitting: Emergency Medicine

## 2020-09-10 DIAGNOSIS — T7840XA Allergy, unspecified, initial encounter: Secondary | ICD-10-CM | POA: Diagnosis not present

## 2020-09-10 DIAGNOSIS — J45909 Unspecified asthma, uncomplicated: Secondary | ICD-10-CM | POA: Diagnosis not present

## 2020-09-10 DIAGNOSIS — L309 Dermatitis, unspecified: Secondary | ICD-10-CM

## 2020-09-10 DIAGNOSIS — Z87891 Personal history of nicotine dependence: Secondary | ICD-10-CM | POA: Insufficient documentation

## 2020-09-10 DIAGNOSIS — R21 Rash and other nonspecific skin eruption: Secondary | ICD-10-CM | POA: Diagnosis present

## 2020-09-10 MED ORDER — TRIAMCINOLONE ACETONIDE 0.1 % EX CREA: 1.0000 "application " | TOPICAL_CREAM | Freq: Two times a day (BID) | CUTANEOUS | 0 refills | Status: DC

## 2020-09-10 MED ORDER — HYDROXYZINE HCL 25 MG PO TABS: 25.0000 mg | ORAL_TABLET | Freq: Four times a day (QID) | ORAL | 0 refills | Status: AC

## 2020-09-10 MED ORDER — HYDROXYZINE HCL 25 MG PO TABS
25.0000 mg | ORAL_TABLET | Freq: Once | ORAL | Status: AC
  Administered 2020-09-10: 25 mg via ORAL
  Filled 2020-09-10: qty 1

## 2020-09-10 NOTE — Discharge Instructions (Addendum)
I recommend switching to a different detergent given that your new detergent seems to be causing perhaps some itchiness.  You may use hydroxyzine as prescribed.  You may use Benadryl at bedtime 25 to 50 mg.  Follow-up with the [facility] health wellness clinic or your primary care provider if you have 1.  You may always return to the ER for any new or concerning symptoms.  There is some chance that this may be pityriasis rosea which is a benign/not harmful self-limited disease that generally resolves with time but may take 4-12 weeks to resolve.  I have also prescribed some triamcinolone which is a steroid you may apply to your skin.  It should help with the itching as well.

## 2020-09-10 NOTE — ED Triage Notes (Signed)
Pt here from home with a rash to her abd and legs , pt has tried some otc meds with some relief , also just switched detergents

## 2020-09-10 NOTE — ED Provider Notes (Signed)
[facility] EMERGENCY DEPARTMENT Provider Note   CSN: [id number] Arrival date & time: 09/10/20  7619     History No chief complaint on file.   Brooke Collins is a 25 y.o. female.  HPI Patient is a 25 year old female with past medical history significant for asthma, headaches, PCOS  Patient is presented today with approximately 1 week of itchy skin of her abdomen, legs and bilateral arms.  She states that she switched detergents to a new detergent approximately 3 weeks ago.  She denies any chest pain shortness of breath lightheadedness dizziness nausea vomiting diarrhea.  Denies any other associated symptoms.  She states that she has used some calamine which did not help significantly and did dry her skin out.  She states she is using hydrocortisone cream which was helpful.  She has not taken any Benadryl or antihistamines or steroids.  No other significant associated symptoms.  No animals in the house no recent travel.  He  She describes the rash as diffuse and itchy nonpainful.  No skin peeling or desquamation.    Past Medical History:  Diagnosis Date  . Asthma   . Headache(784.0)   . PCOS (polycystic ovarian syndrome)     Patient Active Problem List   Diagnosis Date Noted  . 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 Preterm premature rupture of membranes (PPROM) with unknown onset of labor 02/05/2020  . Episodic tension type headache 10/09/2013    Past Surgical History:  Procedure Laterality Date  . ADENOIDECTOMY, TONSILLECTOMY AND MYRINGOTOMY WITH TUBE PLACEMENT Bilateral 2005     OB History    Gravida  1   Para      Term      Preterm      AB      Living  0     SAB      IAB      Ectopic      Multiple      Live Births              Family History  Problem Relation Age of Onset  . Migraines Mother        Onset at early adulthood  . Bronchitis Mother   . Hypertension Mother   . Migraines Father        Onset at  early adulthood  . Bronchitis Father   . Migraines Sister   . Sickle cell trait Cousin   . Diabetes Maternal Aunt   . Diabetes Maternal Grandmother   . Hypertension Maternal Grandmother   . Asthma Maternal Grandmother   . Asthma Other   . Hypertension Other   . Hyperlipidemia Other   . Obesity Other   . Pneumonia Paternal Grandmother   . Psoriasis Paternal Grandfather     Social History   Tobacco Use  . Smoking status: Former Smoker    Types: Cigars    Quit date: 06/12/2019    Years since quitting: 1.2  . Smokeless tobacco: Never Used  Substance Use Topics  . Alcohol use: No  . Drug use: No    Home Medications Prior to Admission medications   Medication Sig Start Date End Date Taking? Authorizing Provider  hydrOXYzine (ATARAX/VISTARIL) 25 MG tablet Take 1 tablet (25 mg total) by mouth every 6 (six) hours for 14 days. 09/10/20 09/24/20 Yes Mohmed Farver S, PA  albuterol (PROVENTIL HFA;VENTOLIN HFA) 108 (90 BASE) MCG/ACT inhaler Inhale 2 puffs into the lungs every 6 (six) hours as needed. Shortness of breath  [provider]  ferrous sulfate 325 (65 FE) MG tablet Take 325 mg by mouth daily with breakfast.    [provider]  metroNIDAZOLE (FLAGYL PO) Take by mouth.    [provider]  Prenatal Vit-Fe Fumarate-FA (PRENATAL VITAMINS PO) Take by mouth.    [provider]  triamcinolone cream (KENALOG) 0.1 % Apply 1 application topically 2 (two) times daily. 09/10/20   Tedd Sias, PA  Calcium Carbonate (CALCIUM 500 PO) Take 1 tablet by mouth daily.  Patient not taking: Reported on 01/10/2020  09/10/20  [provider]  cetirizine (ZYRTEC) 10 MG tablet Take 10 mg by mouth daily as needed. For allergies Patient not taking: Reported on 01/10/2020  09/10/20  [provider]    Allergies    Patient has no known allergies.  Review of Systems   Review of Systems  Constitutional: Negative for fever.  HENT: Negative for  congestion.   Respiratory: Negative for shortness of breath.   Cardiovascular: Negative for chest pain.  Gastrointestinal: Negative for abdominal distention.  Skin: Positive for rash.  Neurological: Negative for dizziness and headaches.    Physical Exam Updated Vital Signs BP (!) 162/106 (BP Location: Right Arm)   Pulse 87   Temp 98.5 F (36.9 C)   Resp 16   SpO2 100%   Physical Exam Vitals and nursing note reviewed.  Constitutional:      General: She is not in acute distress.    Appearance: Normal appearance. She is not ill-appearing.  HENT:     Head: Normocephalic and atraumatic.  Eyes:     General: No scleral icterus.       Right eye: No discharge.        Left eye: No discharge.     Conjunctiva/sclera: Conjunctivae normal.  Pulmonary:     Effort: Pulmonary effort is normal.     Breath sounds: No stridor.  Skin:    General: Skin is warm and dry.     Comments: Diffuse hive-like rash somewhat dry appearing skin on with some central clearing it is diffusely located across bilateral forearms and upper arms anterior abdomen and bilateral legs.  There is some evidence of excoriation.  Neurological:     Mental Status: She is alert and oriented to person, place, and time. Mental status is at baseline.     ED Results / Procedures / Treatments   Labs (all labs ordered are listed, but only abnormal results are displayed) Labs Reviewed - No data to display  EKG None  Radiology No results found.  Procedures Procedures   Medications Ordered in ED Medications  hydrOXYzine (ATARAX/VISTARIL) tablet 25 mg (has no administration in time range)    ED Course  I have reviewed the triage vital signs and the nursing notes.  Pertinent labs & imaging results that were available during my care of the patient were reviewed by me and considered in my medical decision making (see chart for details).    MDM Rules/Calculators/A&P                          Patient with rash for  the past week recently changed laundry detergent.  Will recommend she change again.  May be contact dermatitis versus questionable hives.  Does not actually appear to be hives.  Suspect contact dermatitis although some concern for pityriasis rosea as well given the almost herald patch appearance of some of these lesions although there are notably multiple  of these.  Doubt fungal as it is gotten significantly better with topical steroids.  Will prescribe triamcinolone and recommend hydroxyzine and Benadryl.  Patient given return precautions will follow up with PCP.  Final Clinical Impression(s) / ED Diagnoses Final diagnoses:  Dermatitis  Allergic reaction, initial encounter    Rx / DC Orders ED Discharge Orders         Ordered    hydrOXYzine (ATARAX/VISTARIL) 25 MG tablet  Every 6 hours        09/10/20 0954    triamcinolone cream (KENALOG) 0.1 %  2 times daily,   Status:  Discontinued        09/10/20 0959    triamcinolone cream (KENALOG) 0.1 %  2 times daily        09/10/20 1000           Tedd Sias, [place] 09/10/20 1012    Valarie Merino, MD 09/11/20 2237

## 2022-01-12 ENCOUNTER — Ambulatory Visit (HOSPITAL_COMMUNITY)
Admission: EM | Admit: 2022-01-12 | Discharge: 2022-01-12 | Disposition: A | Discharge status: Home / Self Care | Payer: Medicaid Other | Attending: Emergency Medicine | Admitting: Emergency Medicine

## 2022-01-12 ENCOUNTER — Encounter (HOSPITAL_COMMUNITY): Payer: Self-pay | Admitting: *Deleted

## 2022-01-12 DIAGNOSIS — W57XXXA Bitten or stung by nonvenomous insect and other nonvenomous arthropods, initial encounter: Secondary | ICD-10-CM | POA: Diagnosis not present

## 2022-01-12 DIAGNOSIS — L299 Pruritus, unspecified: Secondary | ICD-10-CM

## 2022-01-12 DIAGNOSIS — T63421A Toxic effect of venom of ants, accidental (unintentional), initial encounter: Secondary | ICD-10-CM | POA: Diagnosis not present

## 2022-01-12 MED ORDER — TRIAMCINOLONE ACETONIDE 0.1 % EX CREA: 1.0000 | TOPICAL_CREAM | Freq: Two times a day (BID) | CUTANEOUS | 0 refills | Status: DC

## 2022-01-12 NOTE — ED Triage Notes (Signed)
Pt has rash on both of her ankles since Saturday, she has been using calamine lotion. She said rash was itching but now it burns and stings.

## 2022-01-12 NOTE — Discharge Instructions (Signed)
I recommend to apply the Kenalog cream twice daily. This is a higher potency steroid cream that should resolve your symptoms.  You can apply ice to the ankles, and elevate them to relieve swelling and pain.  I recommend follow up with your primary care provider as needed.

## 2022-01-12 NOTE — ED Provider Notes (Signed)
[facility]    CSN: [id number] Arrival date & time: 01/12/22  1459     History   Chief Complaint Chief Complaint  Patient presents with   Rash    HPI Brooke Collins is a 26 y.o. female.  Presents with rash around bilateral ankles for 3 days. Reports was initially very itchy and she has been scratching at them a lot causing skin tear.  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 Authorizing Provider  triamcinolone cream (KENALOG) 0.1 % Apply 1 Application topically 2 (two) times daily. 01/12/22  Yes Ahmed Inniss, Wells Guiles, PA-C  albuterol (PROVENTIL HFA;VENTOLIN HFA) 108 (90 BASE) MCG/ACT inhaler Inhale 2 puffs into the lungs every 6 (six) hours as needed. Shortness of breath     [provider]  ferrous sulfate 325 (65 FE) MG tablet Take 325 mg by mouth daily with breakfast.    [provider]  metroNIDAZOLE (FLAGYL PO) Take by mouth.    [provider]  Prenatal Vit-Fe  Fumarate-FA (PRENATAL VITAMINS PO) Take by mouth.    [provider]  Calcium Carbonate (CALCIUM 500 PO) Take 1 tablet by mouth daily.  Patient not taking: Reported on 01/10/2020  09/10/20  [provider]  cetirizine (ZYRTEC) 10 MG tablet Take 10 mg by mouth daily as needed. For allergies Patient not taking: Reported on 01/10/2020  09/10/20  [provider]    Family History Family History  Problem Relation Age of Onset   Migraines Mother        Onset at early adulthood   Bronchitis Mother    Hypertension Mother    Migraines Father        Onset at early adulthood   Bronchitis Father    Migraines Sister    Sickle cell trait Cousin    Diabetes Maternal Aunt    Diabetes Maternal Grandmother    Hypertension Maternal Grandmother    Asthma Maternal Grandmother    Asthma Other    Hypertension Other    Hyperlipidemia Other    Obesity Other    Pneumonia Paternal Grandmother    Psoriasis Paternal Grandfather     Social History Social History   Tobacco Use   Smoking status:  Former    Types: Cigars    Quit date: 06/12/2019    Years since quitting: 2.5   Smokeless tobacco: Never  Substance Use Topics   Alcohol use: No   Drug use: No     Allergies   Patient has no known allergies.   Review of Systems Review of Systems  Skin:  Positive for rash.   Per HPI  Physical Exam Triage Vital Signs ED Triage Vitals  Enc Vitals Group     BP 01/12/22 1519 (!) 146/93     Pulse Rate 01/12/22 1519 83     Resp 01/12/22 1519 18     Temp 01/12/22 1519 99.7 F (37.6 C)     Temp Source 01/12/22 1519 Oral     SpO2 01/12/22 1519 96 %     Weight --      Height --      Head Circumference --      Peak Flow --      Pain Score 01/12/22 1517 6     Pain Loc --      Pain Edu? --      Excl. in [place]? --    No data found.  Updated Vital Signs BP (!) 146/93 (BP Location: Right Arm)   Pulse 83   Temp 99.7 F (37.6 C) (Oral)   Resp 18   LMP 12/27/2021 (Exact  Date)   SpO2 96%   Physical Exam Vitals and nursing note reviewed.  Constitutional:      General: She is not in acute distress.    Appearance: Normal appearance.  HENT:     Mouth/Throat:     Pharynx: Oropharynx is clear. No posterior oropharyngeal erythema.  Eyes:     Conjunctiva/sclera: Conjunctivae normal.  Cardiovascular:     Rate and Rhythm: Normal rate and regular rhythm.     Pulses: Normal pulses.     Heart sounds: Normal heart sounds.  Pulmonary:     Effort: Pulmonary effort is normal.     Breath sounds: Normal breath sounds.  Skin:    Findings: Rash present.     Comments: Multiple pustule lesions circumferentially around bilateral ankles.  Some very excoriated, some tender to touch  Neurological:     Mental Status: She is alert and oriented to person, place, and time.     UC Treatments / Results  Labs (all labs ordered are listed, but only abnormal results are displayed) Labs Reviewed - No data to display  EKG   Radiology No results found.  Procedures Procedures (including critical care time)  Medications Ordered in UC Medications - No data to display  Initial Impression / Assessment and Plan / UC Course  I have reviewed the triage vital signs and the nursing notes.  Pertinent labs & imaging results that were available during my care of the patient were reviewed by me and considered in my medical decision making (see chart for details).  Bites are only around ankles, nowhere else.  Appear to be fire ant bites.  Could be some other insect as well.  Recommend trying higher potency topical steroid such as Kenalog twice daily.  Ice and elevate for pain or swelling. Avoid scratching/itching. She can return to the urgent care if symptoms persist, or follow-up with primary care provider.  Patient agrees to plan  Final Clinical Impressions(s) / UC Diagnoses   Final diagnoses:  Multiple insect bites  Fire ant bite, accidental or unintentional, initial encounter   Pruritus     Discharge  Instructions      I recommend to apply the Kenalog cream twice daily. This is a higher potency steroid cream that should resolve your symptoms.  You can apply ice to the ankles, and elevate them to relieve swelling and pain.  I recommend follow up with your primary care provider as needed.    ED Prescriptions     Medication Sig Dispense Auth. Provider   triamcinolone cream (KENALOG) 0.1 % Apply 1 Application topically 2 (two) times daily. 30 g Khyri Hinzman, Wells Guiles, PA-C      PDMP not reviewed this encounter.   Daxtin Leiker, Vernice Jefferson 01/12/22 1620

## 2022-08-16 IMAGING — US US MFM OB TRANSVAGINAL
1 series · 13 of 17 positions shown · non-contrast
Comparison: none

[Series 1: us mfm ob transvaginal · 17 acquisitions, 13 frames shown]
[im 1/17]
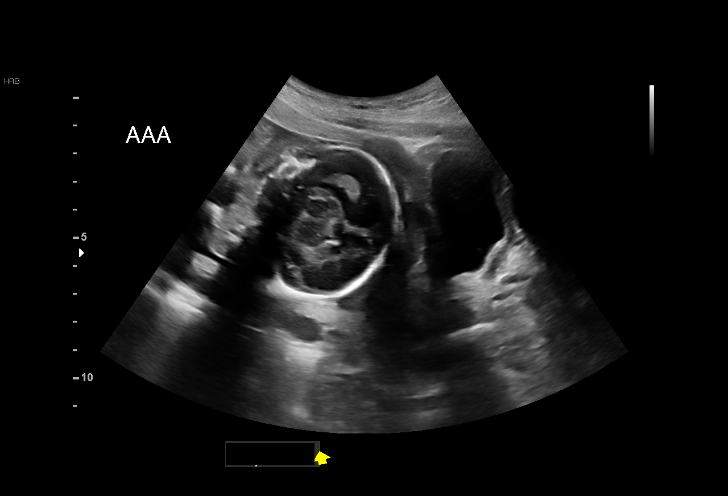
[im 2/17]
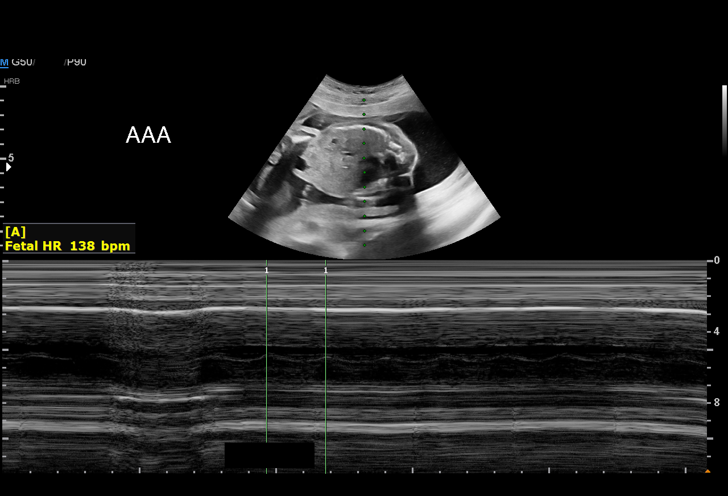
[im 4/17]
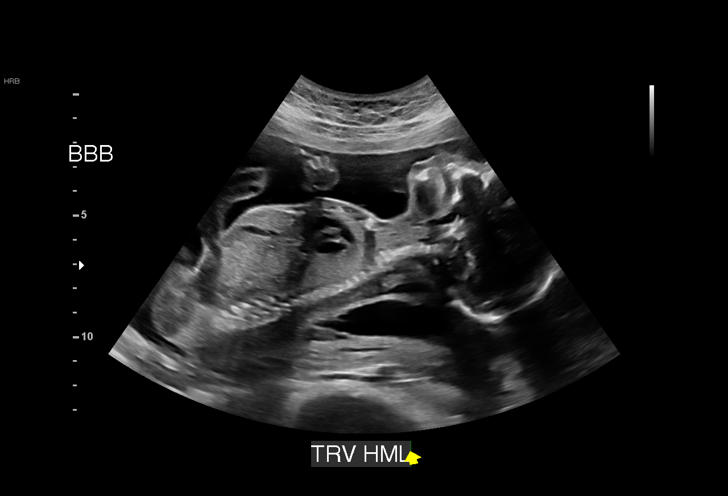
[im 5/17]
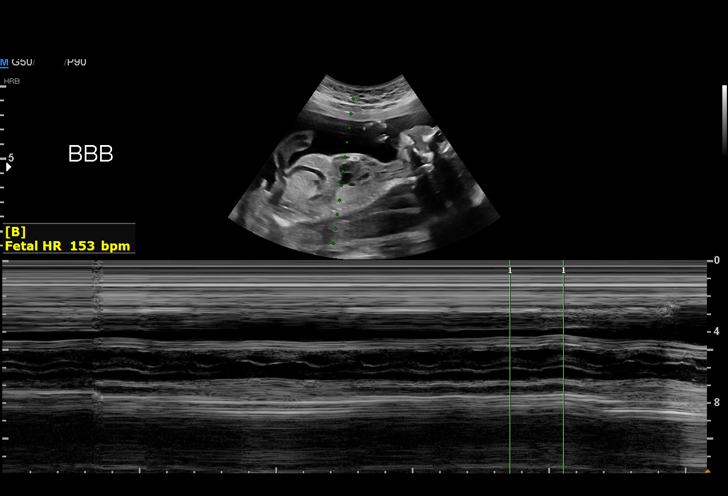
[im 6/17]
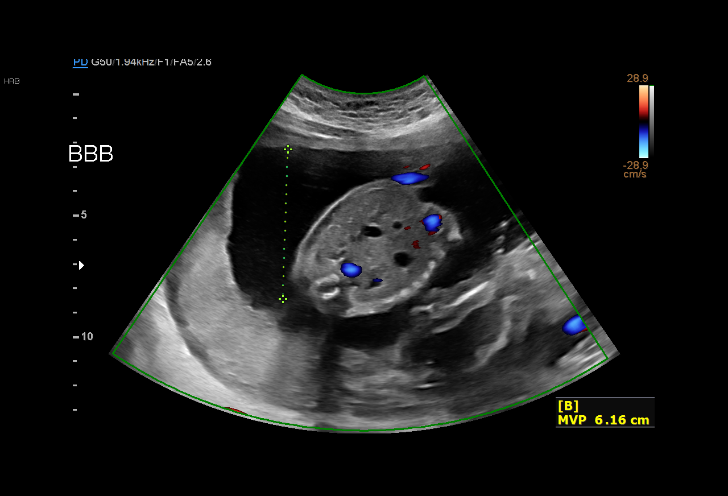
[im 8/17]
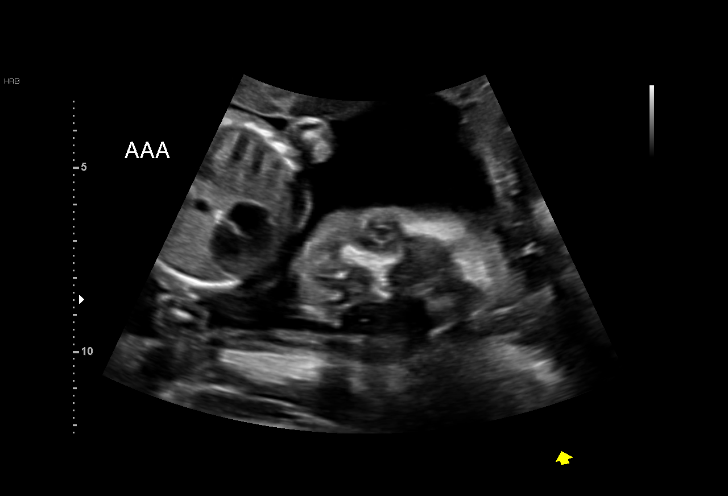
[im 9/17]
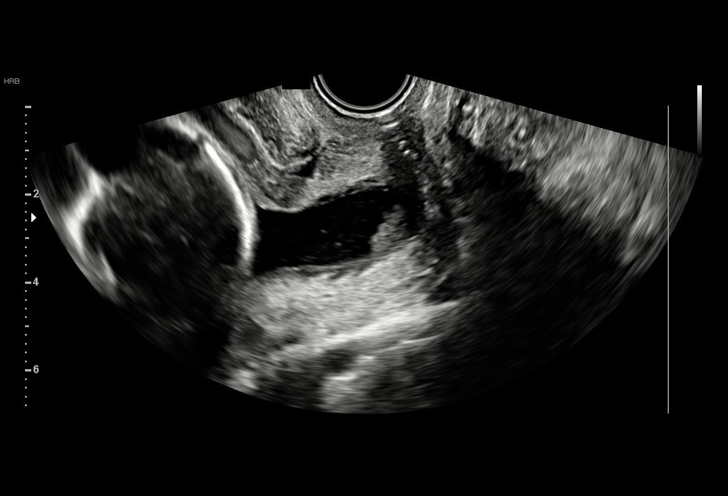
[im 10/17]
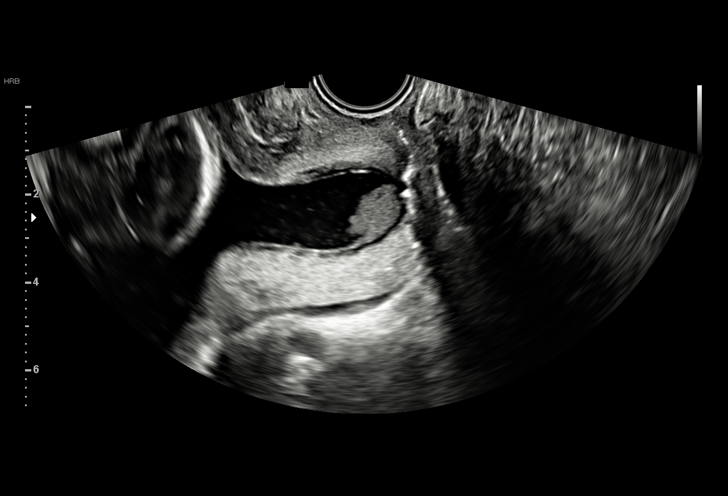
[im 12/17]
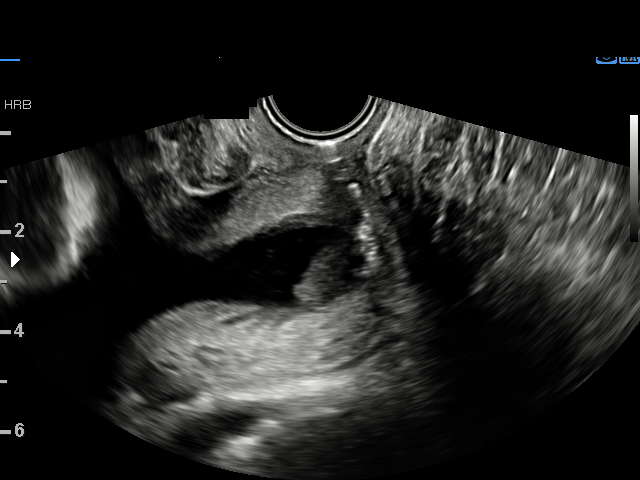
[im 13/17]
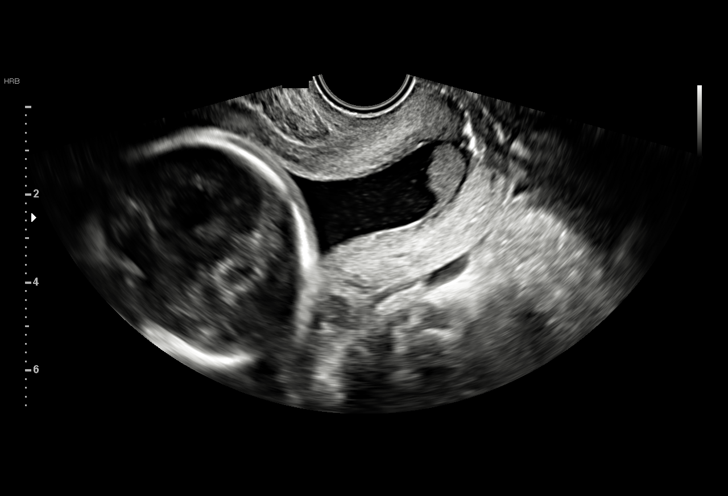
[im 14/17]
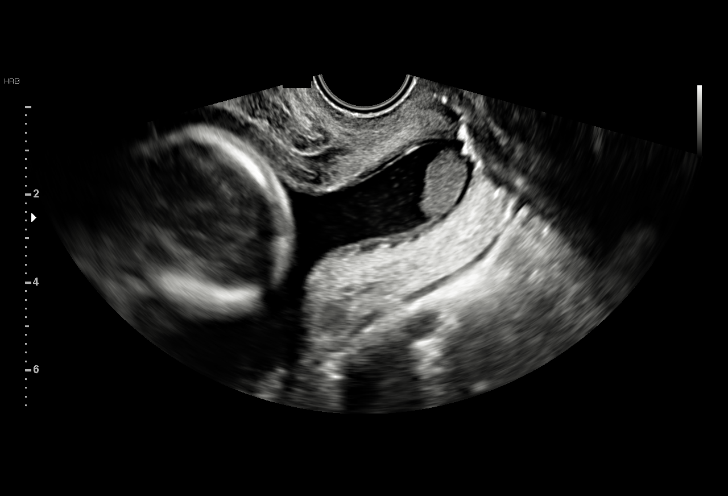
[im 16/17]
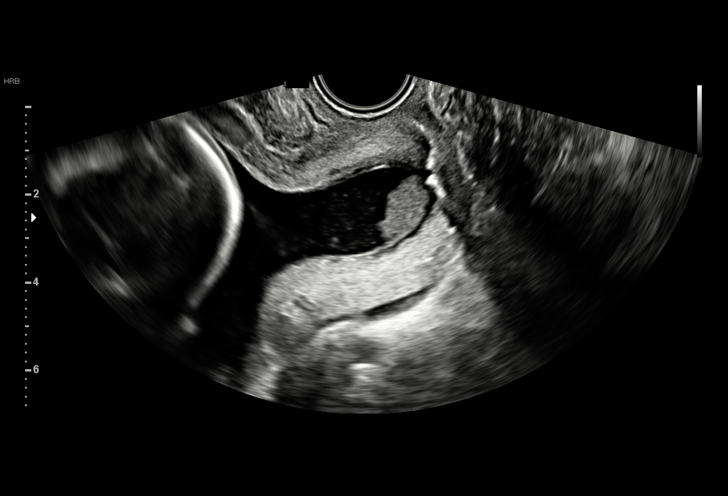
[im 17/17]
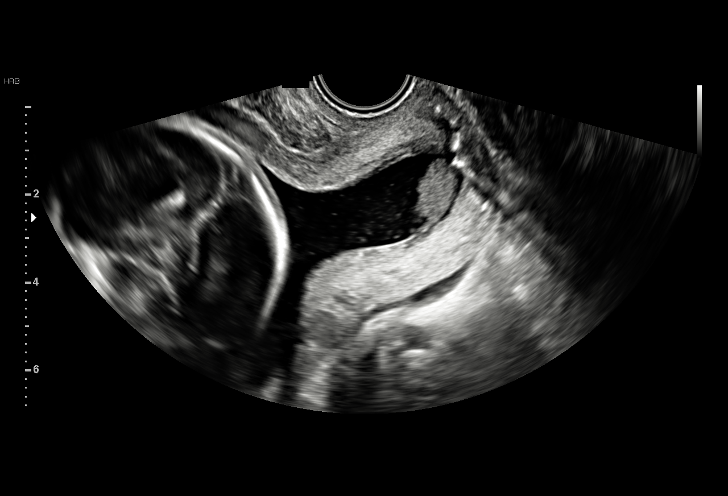

[13 of 17 positions shown; findings below may reference images not displayed]

Obstetrics &
                                                            Gynecology
                                                            [address]
                                                            [place]
                   CN[facility]

 1  US MFM OB TRANSVAGINAL                76817.2     TEPUD
                                                      ROOFING

Indications

 Twin pregnancy, di/di, second trimester
 Uterine fibroids affecting pregnancy in        O34.12,
 second trimester, antepartum
 Encounter for antenatal screening for
 malformations (neg Horizon, low risk NIPS)
 Obesity complicating pregnancy, second
 trimester (BMI 37)
 20 weeks gestation of pregnancy
Vital Signs

                                                Height:        5'0"
Fetal Evaluation (Fetus A)

 Num Of Fetuses:         2
 Fetal Heart Rate(bpm):  138
 Cardiac Activity:       Observed
 Presentation:           Cephalic
 Amniotic Fluid
 AFI FV:      Within normal limits

                             Largest Pocket(cm)

OB History

 Gravidity:    1
Gestational Age (Fetus A)

 LMP:           20w 6d        Date:  08/31/19                 EDD:   06/06/20
 Best:          20w 6d     Det. By:  LMP  (08/31/19)          EDD:   06/06/20

Fetal Evaluation (Fetus B)

 Num Of Fetuses:         2
 Fetal Heart Rate(bpm):  153
 Cardiac Activity:       Observed
 Presentation:           Transverse, head to maternal left

 Amniotic Fluid
 AFI FV:      Within normal limits

                             Largest Pocket(cm)

Gestational Age (Fetus B)

 LMP:           20w 6d        Date:  08/31/19                 EDD:   06/06/20
 Best:          20w 6d     Det. By:  LMP  (08/31/19)          EDD:   06/06/20
Cervix Uterus Adnexa

 Cervix
 Dilated
Comments

 This patient was seen for a follow-up exam due to a
 dichorionic twin gestation where a possible uterine didelphys
 with cervical shortening was suspected during her last
 ultrasound exam.  This is the patient's first pregnancy.  She
 denies any prior surgeries to her cervix and denies feeling
 any lower abdominal cramping or contractions.

 On a transvaginal ultrasound performed today, cervical
 funneling all the way down to the external cervical os was
 noted.  There was debris noted at the end of the cervical
 funnel.  I performed a digital exam and found that the
 patient's cervix was fingertip dilated.  Only one cervix was
 palpated during the exam.

 The patient reports that she had a speculum exam performed
 in your office earlier this week and that only one cervix was
 noted at that time.  During the speculum exam, the patient
 was diagnosed with having trichomonas and Chlamydia
 infections.  She is being treated for these infections.  The
 vaginal infection may have contributed to the cervical
 funneling noted today.

 As she has a twin gestation with a significantly funneled
 cervix, I would recommend that she be treated with daily
 vaginal progesterone in an attempt to prolong her pregnancy
 and to help her achieve a good pregnancy outcome.  As the
 gestational sac is funneled all the way to the external cervical
 os and as cerclage may not always be successful in twin
 gestations, a cervical cerclage was not recommended today.

 The increased risk of a preterm birth due to her significantly
 funneled cervix was discussed with the patient.  She was
 advised that should she reach viability, which is currently
 considered at around 23 weeks, hospitalization on modified
 bedrest along with the administration of a complete course of
 antenatal corticosteroids may be considered at around 23
 weeks.

 Preterm labor precautions were reviewed with the patient
 today.  She was advised to go to the hospital should she feel
 contractions or lower abdominal cramping.

 Today's findings and recommendations were discussed with
 Dr. Sad.  She will prescribe the daily vaginal progesterone
 for the patient.

 A follow-up growth scan and cervical length measurement
 was scheduled in our office in 2 weeks.  Should she remain
 undelivered at that time, hospitalization and the
 administration of a complete course of antenatal
 corticosteroids may be considered.

## 2022-08-28 IMAGING — US US MFM OB FOLLOW-UP
1 series · 12 of 28 positions shown · non-contrast
Comparison: none

[Series 1: us mfm ob follow-up · 86 acquisitions, 12 frames shown]
[im 4/86]
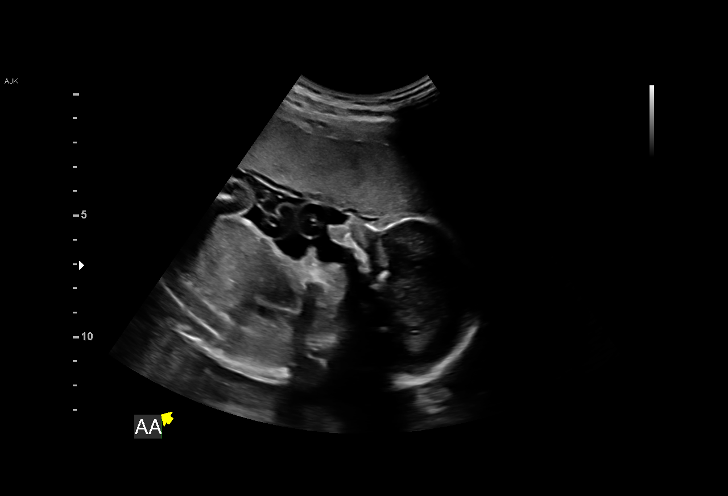
[im 10/86]
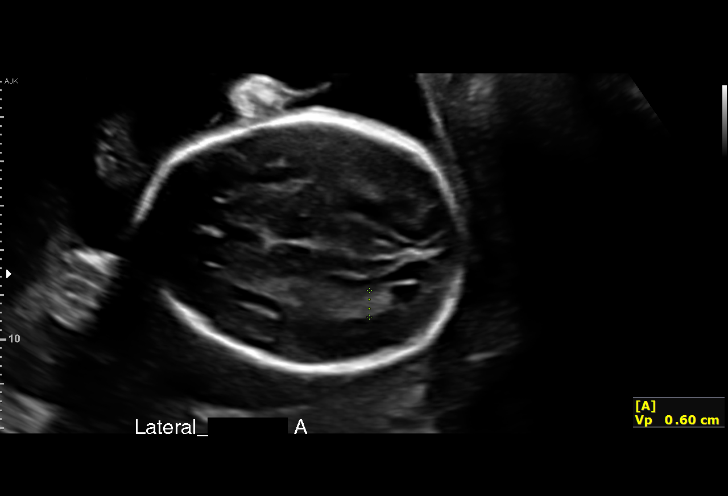
[im 16/86]
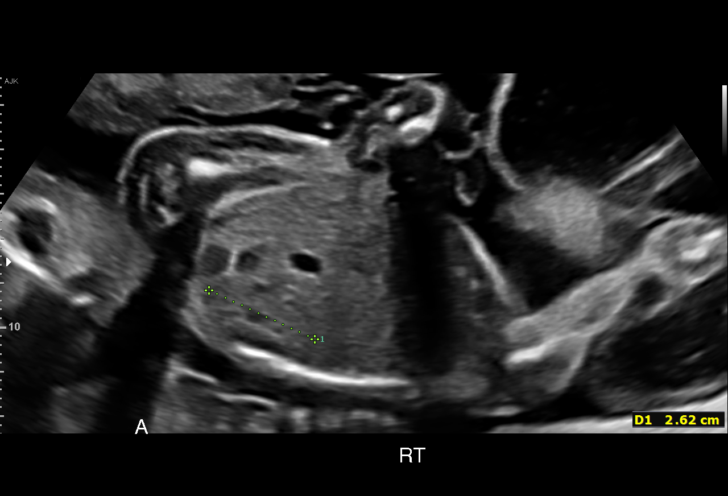
[im 26/86]
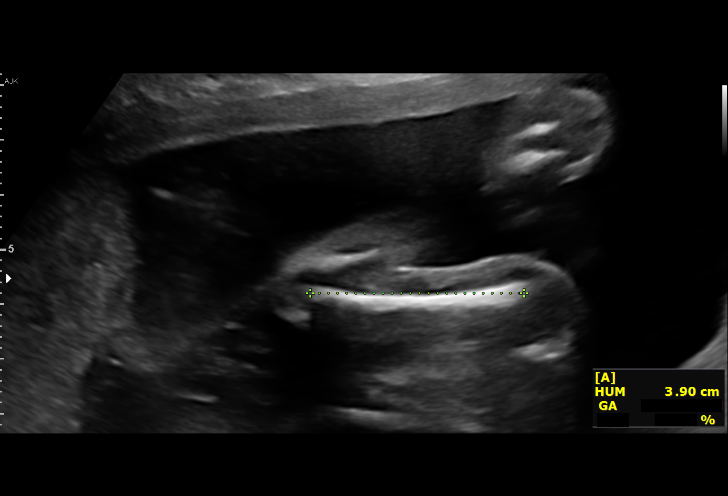
[im 32/86]
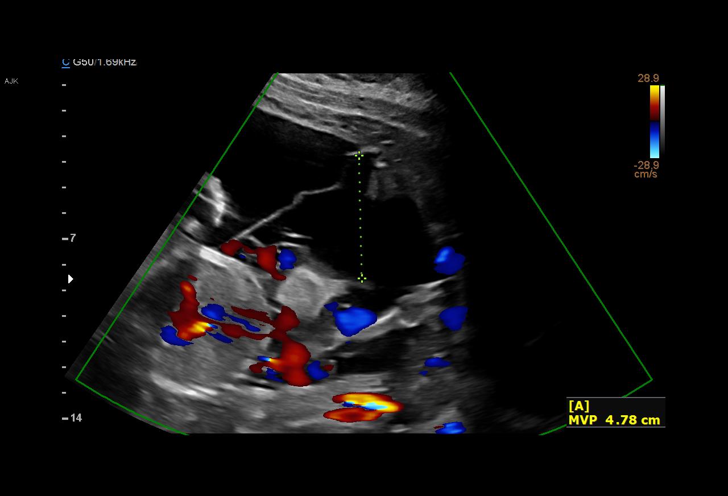
[im 38/86]
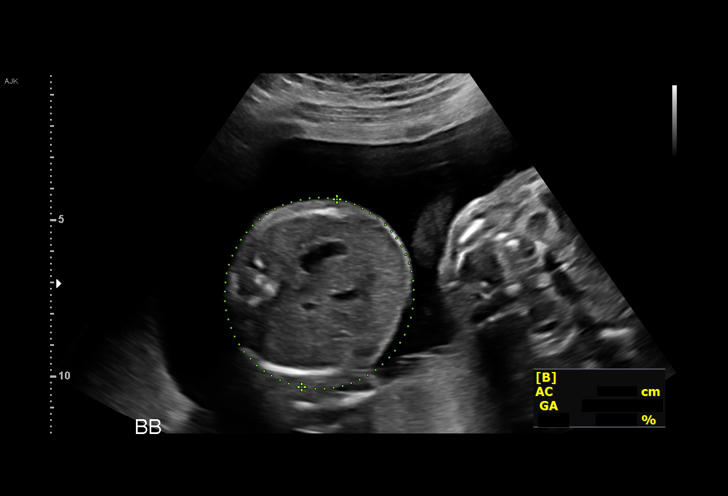
[im 48/86]
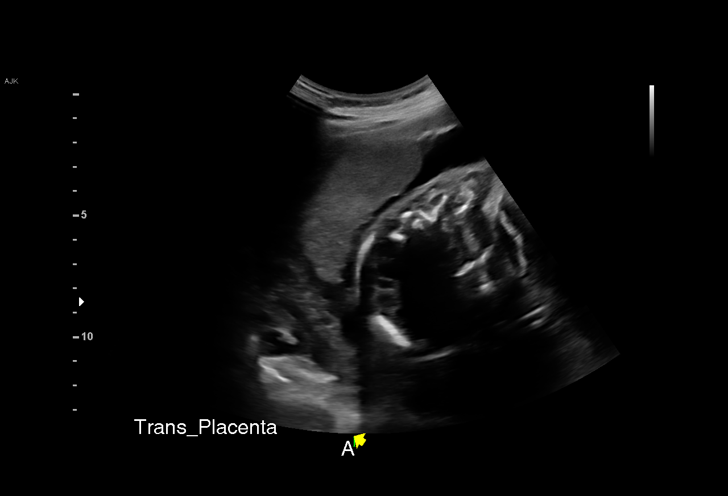
[im 54/86]
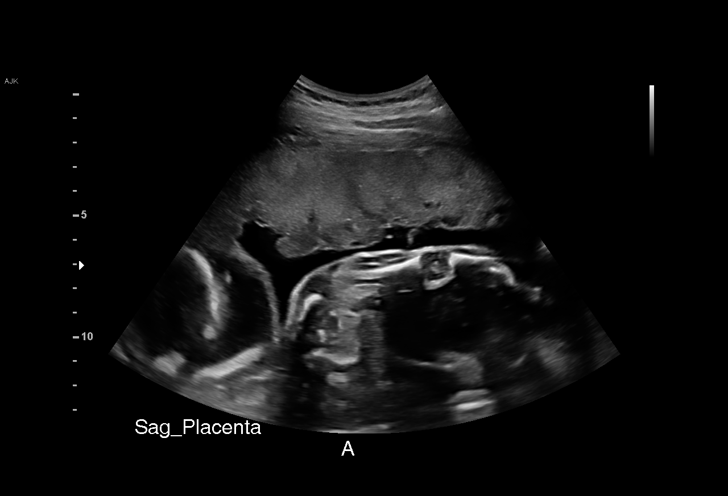
[im 60/86]
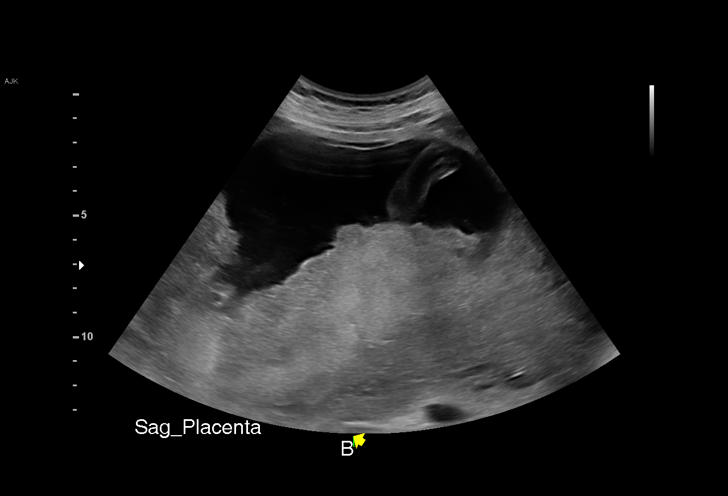
[im 70/86]
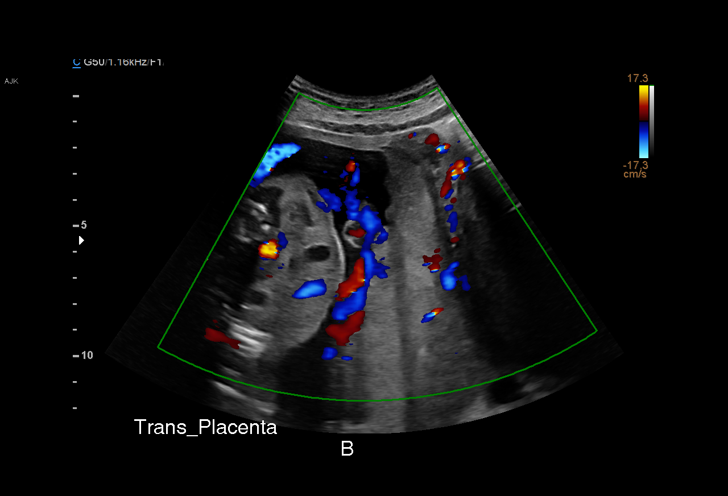
[im 76/86]
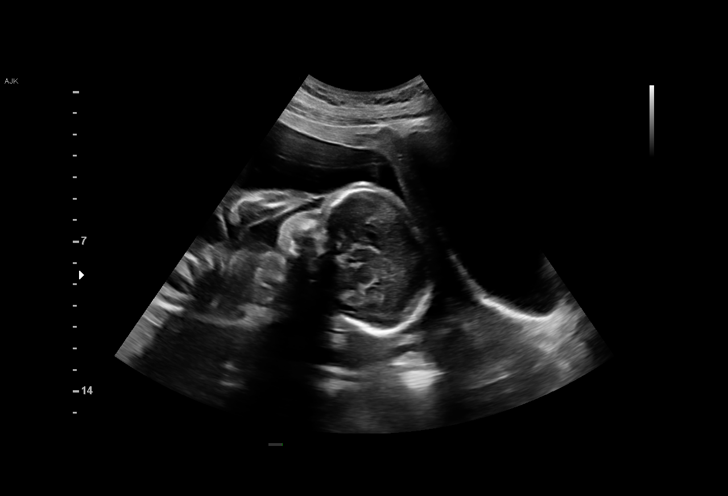
[im 82/86]
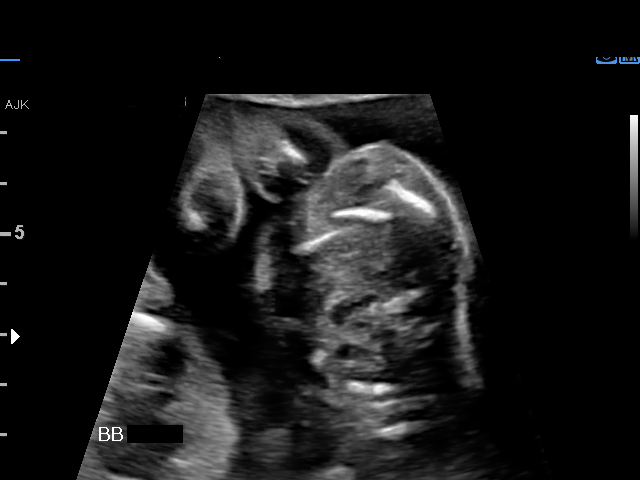

[12 of 28 positions shown; findings below may reference images not displayed]

Obstetrics &
                                                            Gynecology
                                                            [id number]
                                                            [place]
                   CNM

    [facility]

Indications

 Premature rupture of membranes - leaking
 fluid
 Twin pregnancy, di/di, second trimester
 22 weeks gestation of pregnancy
Vital Signs

                                                Height:        5'0"
Fetal Evaluation (Fetus A)

 Num Of Fetuses:         2
 Fetal Heart Rate(bpm):  131
 Cardiac Activity:       Observed
 Fetal Lie:              Lower Fetus
 Presentation:           Cephalic
 Placenta:               Right lateral

 Amniotic Fluid
 AFI FV:      Within normal limits

                             Largest Pocket(cm)

Biometry (Fetus A)

 BPD:      56.4  mm     G. Age:  23w 2d         71  %    CI:         76.3   %    70 - 86
                                                         FL/HC:      18.3   %    19.2 -
 HC:      204.6  mm     G. Age:  22w 4d         37  %    HC/AC:      1.03        1.05 -
 AC:      198.3  mm     G. Age:  24w 3d         92  %    FL/BPD:     66.5   %    71 - 87
 FL:       37.5  mm     G. Age:  22w 0d         21  %    FL/AC:      18.9   %    20 - 24
 HUM:      37.9  mm     G. Age:  23w 2d         63  %
 LV:          6  mm

 Est. FW:     582  gm      1 lb 5 oz     79  %     FW Discordancy      0 \ 5 %
OB History

 Gravidity:    1
Gestational Age (Fetus A)

 LMP:           22w 4d        Date:  08/31/19                 EDD:   06/06/20
 U/S Today:     23w 1d                                        EDD:   06/02/20
 Best:          22w 4d     Det. By:  LMP  (08/31/19)          EDD:   06/06/20
Anatomy (Fetus A)

 Cranium:               Appears normal         Aortic Arch:            Previously seen
 Cavum:                 Appears normal         Ductal Arch:            Previously seen
 Ventricles:            Appears normal         Diaphragm:              Appears normal
 Choroid Plexus:        Previously seen        Stomach:                Appears normal, left
                                                                       sided
 Cerebellum:            Previously seen        Abdomen:                Appears normal
 Posterior Fossa:       Previously seen        Abdominal Wall:         Previously seen
 Nuchal Fold:           Previously seen        Cord Vessels:           Previously seen
 Face:                  Orbits and profile     Kidneys:                Appear normal
                        previously seen
 Lips:                  Previously seen        Bladder:                Appears normal
 Thoracic:              Appears normal         Spine:                  Limited views
                                                                       previously seen
 Heart:                 Appears normal         Upper Extremities:      Previously seen
                        (4CH, axis, and
                        situs)
 RVOT:                  Appears normal         Lower Extremities:      Previously seen
 LVOT:                  Appears normal

 Other:  Technically difficult due to fetal position.

Fetal Evaluation (Fetus B)

 Num Of Fetuses:         2
 Fetal Heart Rate(bpm):  148
 Cardiac Activity:       Observed
 Fetal Lie:              Upper Fetus
 Presentation:           Breech
 Placenta:               Left lateral

 Amniotic Fluid
 AFI FV:      Within normal limits

                             Largest Pocket(cm)

Biometry (Fetus B)

 BPD:      56.4  mm     G. Age:  23w 2d         71  %    CI:        74.07   %    70 - 86
                                                         FL/HC:      18.2   %    19.2 -
 HC:      208.1  mm     G. Age:  22w 6d         50  %    HC/AC:      1.10        1.05 -
 AC:      189.7  mm     G. Age:  23w 5d         78  %    FL/BPD:     67.2   %    71 - 87
 FL:       37.9  mm     G. Age:  22w 1d         25  %    FL/AC:      20.0   %    20 - 24
 HUM:        37  mm     G. Age:  22w 6d         53  %

 Est. FW:     555  gm      1 lb 4 oz     66  %     FW Discordancy         5  %
Gestational Age (Fetus B)

 LMP:           22w 4d        Date:  08/31/19                 EDD:   06/06/20
 U/S Today:     23w 0d                                        EDD:   06/03/20
 Best:          22w 4d     Det. By:  LMP  (08/31/19)          EDD:   06/06/20
Anatomy (Fetus B)

 Cranium:               Appears normal         Aortic Arch:            Appears normal
 Cavum:                 Appears normal         Ductal Arch:            Previously seen
 Ventricles:            Appears normal         Diaphragm:              Appears normal
 Choroid Plexus:        Previously seen        Stomach:                Appears normal, left
                                                                       sided
 Cerebellum:            Previously seen        Abdomen:                Appears normal
 Posterior Fossa:       Previously seen        Abdominal Wall:         Not well visualized
 Nuchal Fold:           Previously seen        Cord Vessels:           Not well visualized
 Face:                  Orbits previously      Kidneys:                Previously seen
                        seen
 Lips:                  Previously seen        Bladder:                Appears normal
 Thoracic:              Appears normal         Spine:                  Previously seen
 Heart:                 Appears normal         Upper Extremities:      Previously seen
                        (4CH, axis, and
                        situs)
 RVOT:                  Appears normal         Lower Extremities:      Previously seen
 LVOT:                  Appears normal

 Other:  Technically difficult due to fetal position.
Comments

 This patient was admitted due to a dichorionic, diamniotic
 twin gestation with PPROM of twin A.  She has been followed
 due to a shortened and funneled cervix.  The patient denies
 feeling any contractions.  Her cervix was reported as being 3
 cm dilated.

 On today's exam, the fetal growth and amniotic fluid level
 appeared appropriate for her gestational age for both twin A
 and twin B.

 Due to the potential for delivery in the near future and as the
 NICU at [HOSPITAL] will not resuscitate babies delivered at
 22 weeks, the patient will be transferred to [facility]
 [facility] for further management.  She was given the
 first dose of antenatal corticosteroids today.

## 2022-09-10 ENCOUNTER — Encounter (HOSPITAL_COMMUNITY): Payer: Self-pay | Admitting: *Deleted

## 2022-09-10 ENCOUNTER — Other Ambulatory Visit: Payer: Self-pay

## 2022-09-10 ENCOUNTER — Ambulatory Visit (HOSPITAL_COMMUNITY)
Admission: EM | Admit: 2022-09-10 | Discharge: 2022-09-10 | Disposition: A | Discharge status: Home / Self Care | Payer: Medicaid Other | Attending: Emergency Medicine | Admitting: Emergency Medicine

## 2022-09-10 DIAGNOSIS — H60501 Unspecified acute noninfective otitis externa, right ear: Secondary | ICD-10-CM | POA: Diagnosis not present

## 2022-09-10 DIAGNOSIS — H6501 Acute serous otitis media, right ear: Secondary | ICD-10-CM

## 2022-09-10 MED ORDER — AMOXICILLIN-POT CLAVULANATE 875-125 MG PO TABS: 1.0000 | ORAL_TABLET | Freq: Two times a day (BID) | ORAL | 0 refills | Status: AC

## 2022-09-10 MED ORDER — NEOMYCIN-POLYMYXIN-HC 3.5-10000-1 OT SUSP: 4.0000 [drp] | Freq: Three times a day (TID) | OTIC | 0 refills | Status: AC

## 2022-09-10 NOTE — ED Triage Notes (Signed)
C/O muffled hearing in right ear onset yesterday; c/o sensitivity to wind in right ear along with pain; c/o pressure in right ear when laying on right side. Also c/o sore throat onset yesterday. Denies fevers. C/O rhinorrhea and nasal congestion. Has taken Mucinex Sinus Max.

## 2022-09-10 NOTE — Discharge Instructions (Addendum)
Your ear does appear to be infected, I am covering you with oral and antibiotic drops.  Please take all antibiotics as prescribed and until finished.  For symptomatic management of your sore throat you can sleep with a humidifier, do warm saline gargles, tea with honey, lozenges and popsicles.  You can do 1200 mg of Mucinex to help loosen up your nasal congestion and combination with plenty of fluids, at least 64 ounces of water daily.  Please return to clinic if you do not have any improvement over the next 3 days, develop high fever, wheezing, shortness of breath, or any new concerning symptoms.

## 2022-09-10 NOTE — ED Provider Notes (Signed)
[facility]    CSN: [id number] Arrival date & time: 09/10/22  1015      History   Chief Complaint Chief Complaint  Patient presents with   Otalgia   Sore Throat    HPI Brooke Collins is a 27 y.o. female.   Patient presents to the clinic for right ear pain that started yesterday.  Reports it is sensitive when the wind blows, also has pressure in her ear and is unable to lay on her right side.  Her sore throat also started yesterday with nasal congestion.  She has been taking Mucinex for her congestion.  She denies fevers.  Denies any nasal drainage.    Hx of tonsillectomy w/ adenoid removal, tubes in ears as a child.   The history is provided by the patient and medical records.  Otalgia Associated symptoms: congestion, rhinorrhea and sore throat   Associated symptoms: no abdominal pain, no cough and no fever   Sore Throat Pertinent negatives include no chest pain and no abdominal pain.    Past Medical History:  Diagnosis Date   Asthma    Headache(784.0)    PCOS (polycystic ovarian syndrome)     Patient Active Problem List   Diagnosis Date Noted   Twin gestation, dichorionic diamniotic 02/05/2020   Preterm premature rupture of membranes (PPROM) with unknown onset of labor 02/05/2020   Episodic tension type headache 10/09/2013    Past Surgical History:  Procedure Laterality Date   ADENOIDECTOMY, TONSILLECTOMY AND MYRINGOTOMY WITH TUBE PLACEMENT Bilateral 2005    OB History     Gravida  1   Para      Term      Preterm      AB      Living  0      SAB      IAB      Ectopic      Multiple      Live Births               Home Medications    Prior to Admission medications   Medication Sig Start Date End Date Taking? Authorizing Provider  amoxicillin-clavulanate (AUGMENTIN) 875-125 MG tablet Take 1 tablet by mouth every 12 (twelve) hours. 09/10/22  Yes Rinaldo Ratel, [place] N, FNP  neomycin-polymyxin-hydrocortisone (CORTISPORIN)  3.5-10000-1 OTIC suspension Place 4 drops into the right ear 3 (three) times daily for 5 days. 09/10/22 09/15/22 Yes Rinaldo Ratel, [place] N, FNP  Calcium Carbonate (CALCIUM 500 PO) Take 1 tablet by mouth daily.  Patient not taking: Reported on 01/10/2020  09/10/20  [provider]  cetirizine (ZYRTEC) 10 MG tablet Take 10 mg by mouth daily as needed. For allergies Patient not taking: Reported on 01/10/2020  09/10/20  [provider]    Family History Family History  Problem Relation Age of Onset   Migraines Mother        Onset at early adulthood   Bronchitis Mother    Hypertension Mother    Migraines Father        Onset at early adulthood   Bronchitis Father    Migraines Sister    Sickle cell trait Cousin    Diabetes Maternal Aunt    Diabetes Maternal Grandmother    Hypertension Maternal Grandmother    Asthma Maternal Grandmother    Asthma Other    Hypertension Other    Hyperlipidemia Other    Obesity Other    Pneumonia Paternal Grandmother    Psoriasis Paternal Grandfather  Social History Social History   Tobacco Use   Smoking status: Former    Types: Cigars    Quit date: 06/12/2019    Years since quitting: 3.2   Smokeless tobacco: Never  Vaping Use   Vaping Use: Never used  Substance Use Topics   Alcohol use: No   Drug use: No     Allergies   Patient has no known allergies.   Review of Systems Review of Systems  Constitutional:  Negative for fatigue and fever.  HENT:  Positive for congestion, ear pain, rhinorrhea and sore throat. Negative for trouble swallowing and voice change.   Respiratory:  Negative for cough.   Cardiovascular:  Negative for chest pain.  Gastrointestinal:  Negative for abdominal pain.     Physical Exam Triage Vital Signs ED Triage Vitals  Enc Vitals Group     BP 09/10/22 1123 (!) 142/90     Pulse Rate 09/10/22 1123 72     Resp 09/10/22 1123 14     Temp 09/10/22 1123 98.3 F (36.8 C)     Temp Source 09/10/22 1123  Oral     SpO2 09/10/22 1123 96 %     Weight --      Height --      Head Circumference --      Peak Flow --      Pain Score 09/10/22 1124 8     Pain Loc --      Pain Edu? --      Excl. in GC? --    No data found.  Updated Vital Signs BP (!) 142/90   Pulse 72   Temp 98.3 F (36.8 C) (Oral)   Resp 14   SpO2 96%   Breastfeeding No   Visual Acuity Right Eye Distance:   Left Eye Distance:   Bilateral Distance:    Right Eye Near:   Left Eye Near:    Bilateral Near:     Physical Exam Vitals and nursing note reviewed.  Constitutional:      Appearance: She is well-developed.  HENT:     Head: Normocephalic and atraumatic.     Right Ear: Drainage, swelling and tenderness present. A middle ear effusion is present. Tympanic membrane is erythematous.     Left Ear: Tympanic membrane and ear canal normal.     Nose: Congestion and rhinorrhea present.     Mouth/Throat:     Mouth: Mucous membranes are moist.     Pharynx: Uvula midline. Posterior oropharyngeal erythema present.  Eyes:     Conjunctiva/sclera: Conjunctivae normal.  Cardiovascular:     Rate and Rhythm: Normal rate and regular rhythm.     Heart sounds: Normal heart sounds. No murmur heard. Pulmonary:     Effort: Pulmonary effort is normal. No respiratory distress.     Breath sounds: Normal breath sounds.  Musculoskeletal:     Cervical back: Normal range of motion.  Lymphadenopathy:     Cervical: Cervical adenopathy present.  Skin:    General: Skin is warm and dry.  Neurological:     General: No focal deficit present.     Mental Status: She is alert and oriented to person, place, and time.  Psychiatric:        Mood and Affect: Mood normal.        Behavior: Behavior normal.      UC Treatments / Results  Labs (all labs ordered are listed, but only abnormal results are displayed) Labs Reviewed - No data to  display  EKG   Radiology No results found.  Procedures Procedures (including critical care  time)  Medications Ordered in UC Medications - No data to display  Initial Impression / Assessment and Plan / UC Course  I have reviewed the triage vital signs and the nursing notes.  Pertinent labs & imaging results that were available during my care of the patient were reviewed by me and considered in my medical decision making (see chart for details).  Vitals and triage reviewed, patient is hemodynamically stable.  Right ear with tenderness to palpation with tragus and auricle manipulation.  External ear canal with erythema and drainage.  Tympanic membrane is erythematous and bulging.  Nasal congestion and rhinorrhea with postnasal drip, posterior pharynx with erythema.  Tonsils surgically absent.  Discussed we could swab for strep, however, the antibiotics for her ear infection will cover for Streptococcus.  Patient declined strep swab.  Will cover for acute otitis media and otitis externa.  Follow-up, plan of care and return precautions reviewed, no questions at this time.    Final Clinical Impressions(s) / UC Diagnoses   Final diagnoses:  Non-recurrent acute serous otitis media of right ear  Acute otitis externa of right ear, unspecified type     Discharge Instructions      Your ear does appear to be infected, I am covering you with oral and antibiotic drops.  Please take all antibiotics as prescribed and until finished.  For symptomatic management of your sore throat you can sleep with a humidifier, do warm saline gargles, tea with honey, lozenges and popsicles.  You can do 1200 mg of Mucinex to help loosen up your nasal congestion and combination with plenty of fluids, at least 64 ounces of water daily.  Please return to clinic if you do not have any improvement over the next 3 days, develop high fever, wheezing, shortness of breath, or any new concerning symptoms.      ED Prescriptions     Medication Sig Dispense Auth. Provider   neomycin-polymyxin-hydrocortisone  (CORTISPORIN) 3.5-10000-1 OTIC suspension Place 4 drops into the right ear 3 (three) times daily for 5 days. 10 mL Kahlen Boyde, [place] N, FNP   amoxicillin-clavulanate (AUGMENTIN) 875-125 MG tablet Take 1 tablet by mouth every 12 (twelve) hours. 14 tablet Jewelz Kobus, [place] N, [place]      PDMP not reviewed this encounter.   Keina Mutch, [place] N, [place] 09/10/22 1144
